# Patient Record
Sex: Male | Born: 1940 | Race: White | Hispanic: No | Marital: Married | Smoking: Never smoker
Health system: Southern US, Community
[De-identification: ages and names within clinical notes are randomized; demographics above are authoritative.]

## PROBLEM LIST (undated history)

## (undated) DIAGNOSIS — I10 Essential (primary) hypertension: Secondary | ICD-10-CM

## (undated) DIAGNOSIS — F32A Depression, unspecified: Secondary | ICD-10-CM

## (undated) DIAGNOSIS — I499 Cardiac arrhythmia, unspecified: Secondary | ICD-10-CM

## (undated) DIAGNOSIS — I739 Peripheral vascular disease, unspecified: Secondary | ICD-10-CM

## (undated) DIAGNOSIS — F329 Major depressive disorder, single episode, unspecified: Secondary | ICD-10-CM

## (undated) DIAGNOSIS — E785 Hyperlipidemia, unspecified: Secondary | ICD-10-CM

## (undated) DIAGNOSIS — J189 Pneumonia, unspecified organism: Secondary | ICD-10-CM

## (undated) DIAGNOSIS — F419 Anxiety disorder, unspecified: Secondary | ICD-10-CM

## (undated) DIAGNOSIS — I7789 Other specified disorders of arteries and arterioles: Secondary | ICD-10-CM

## (undated) DIAGNOSIS — N400 Enlarged prostate without lower urinary tract symptoms: Secondary | ICD-10-CM

## (undated) DIAGNOSIS — G709 Myoneural disorder, unspecified: Secondary | ICD-10-CM

## (undated) DIAGNOSIS — K219 Gastro-esophageal reflux disease without esophagitis: Secondary | ICD-10-CM

## (undated) DIAGNOSIS — M1990 Unspecified osteoarthritis, unspecified site: Secondary | ICD-10-CM

## (undated) HISTORY — PX: EYE SURGERY: SHX253

## (undated) HISTORY — PX: ADENOIDECTOMY: SUR15

## (undated) HISTORY — PX: TONSILLECTOMY: SUR1361

## (undated) HISTORY — PX: BACK SURGERY: SHX140

## (undated) HISTORY — PX: HERNIA REPAIR: SHX51

## (undated) HISTORY — DX: Hyperlipidemia, unspecified: E78.5

---

## 2014-01-24 ENCOUNTER — Other Ambulatory Visit: Payer: Self-pay | Admitting: Neurological Surgery

## 2014-02-02 NOTE — Pre-Procedure Instructions (Signed)
Richard BrawnRobert Williams  02/02/2014   Your procedure is scheduled on:  Thurs, Oct 8 @ 10:00 AM  [facility]  at 8:00 AM.  Call this number if you have problems the morning of surgery: [phone]   Remember:   Do not eat food or drink liquids after midnight.   Take these medicines the morning of surgery with A SIP OF WATER: Wellbutrin(Bupropion),Nasocort(Traimcinolone), and Buspirone(Buspar)               Stop taking your Aspirin. No Goody's,BC's,Aleve,Ibuprofen,Fish Oil,or any Herbal Medications   Do not wear jewelry  Do not wear lotions, powders, or colognes. You may wear deodorant.  Men may shave face and neck.  Do not bring valuables to the hospital.  [facility] is not responsible                  for any belongings or valuables.               Contacts, dentures or bridgework may not be worn into surgery.  Leave suitcase in the car. After surgery it may be brought to your room.  For patients admitted to the hospital, discharge time is determined by your                treatment team.               Patients discharged the day of surgery will not be allowed to drive  home.    Special Instructions:  [facility] - Preparing for Surgery  Before surgery, you can play an important role.  Because skin is not sterile, your skin needs to be as free of germs as possible.  You can reduce the number of germs on you skin by washing with CHG (chlorahexidine gluconate) soap before surgery.  CHG is an antiseptic cleaner which kills germs and bonds with the skin to continue killing germs even after washing.  Please DO NOT use if you have an allergy to CHG or antibacterial soaps.  If your skin becomes reddened/irritated stop using the CHG and inform your nurse when you arrive at Short Stay.  Do not shave (including legs and underarms) for at least 48 hours prior to the first CHG shower.  You may shave your face.  Please follow these instructions carefully:   1.  Shower with CHG Soap  the night before surgery and the                                morning of Surgery.  2.  If you choose to wash your hair, wash your hair first as usual with your       normal shampoo.  3.  After you shampoo, rinse your hair and body thoroughly to remove the                      Shampoo.  4.  Use CHG as you would any other liquid soap.  You can apply chg directly       to the skin and wash gently with scrungie or a clean washcloth.  5.  Apply the CHG Soap to your body ONLY FROM THE NECK DOWN.        Do not use on open wounds or open sores.  Avoid contact with your eyes,       ears, mouth and genitals (private parts).  Wash genitals (  private parts)       with your normal soap.  6.  Wash thoroughly, paying special attention to the area where your surgery        will be performed.  7.  Thoroughly rinse your body with warm water from the neck down.  8.  DO NOT shower/wash with your normal soap after using and rinsing off       the CHG Soap.  9.  Pat yourself dry with a clean towel.            10.  Wear clean pajamas.            11.  Place clean sheets on your bed the night of your first shower and do not        sleep with pets.  Day of Surgery  Do not apply any lotions/deoderants the morning of surgery.  Please wear clean clothes to the hospital/surgery center.     Please read over the following fact sheets that you were given: Pain Booklet, Coughing and Deep Breathing, MRSA Information and Surgical Site Infection Prevention

## 2014-02-03 ENCOUNTER — Encounter (HOSPITAL_COMMUNITY): Payer: Self-pay

## 2014-02-03 ENCOUNTER — Encounter (HOSPITAL_COMMUNITY): Payer: Self-pay | Admitting: Pharmacy Technician

## 2014-02-03 ENCOUNTER — Ambulatory Visit (HOSPITAL_COMMUNITY)
Admission: RE | Admit: 2014-02-03 | Discharge: 2014-02-03 | Disposition: A | Discharge status: Home / Self Care | Payer: Medicare Other | Source: Ambulatory Visit | Attending: Anesthesiology | Admitting: Anesthesiology

## 2014-02-03 ENCOUNTER — Ambulatory Visit (HOSPITAL_COMMUNITY)
Admission: RE | Admit: 2014-02-03 | Discharge: 2014-02-03 | Disposition: A | Discharge status: Home / Self Care | Payer: Medicare Other | Source: Ambulatory Visit | Attending: Neurological Surgery | Admitting: Neurological Surgery

## 2014-02-03 DIAGNOSIS — I10 Essential (primary) hypertension: Secondary | ICD-10-CM | POA: Insufficient documentation

## 2014-02-03 DIAGNOSIS — Z01818 Encounter for other preprocedural examination: Secondary | ICD-10-CM | POA: Diagnosis present

## 2014-02-03 HISTORY — DX: Anxiety disorder, unspecified: F41.9

## 2014-02-03 HISTORY — DX: Major depressive disorder, single episode, unspecified: F32.9

## 2014-02-03 HISTORY — DX: Depression, unspecified: F32.A

## 2014-02-03 HISTORY — DX: Pneumonia, unspecified organism: J18.9

## 2014-02-03 HISTORY — DX: Peripheral vascular disease, unspecified: I73.9

## 2014-02-03 HISTORY — DX: Unspecified osteoarthritis, unspecified site: M19.90

## 2014-02-03 HISTORY — DX: Benign prostatic hyperplasia without lower urinary tract symptoms: N40.0

## 2014-02-03 HISTORY — DX: Gastro-esophageal reflux disease without esophagitis: K21.9

## 2014-02-03 HISTORY — DX: Essential (primary) hypertension: I10

## 2014-02-03 HISTORY — DX: Cardiac arrhythmia, unspecified: I49.9

## 2014-02-03 LAB — CBC
HCT: 40.7 % (ref 39.0–52.0)
Hemoglobin: 14.3 g/dL (ref 13.0–17.0)
MCH: 33.4 pg (ref 26.0–34.0)
MCHC: 35.1 g/dL (ref 30.0–36.0)
MCV: 95.1 fL (ref 78.0–100.0)
PLATELETS: 173 10*3/uL (ref 150–400)
RBC: 4.28 MIL/uL (ref 4.22–5.81)
RDW: 12.1 % (ref 11.5–15.5)
WBC: 6.2 10*3/uL (ref 4.0–10.5)

## 2014-02-03 LAB — BASIC METABOLIC PANEL
Anion gap: 12 (ref 5–15)
BUN: 30 mg/dL — ABNORMAL HIGH (ref 6–23)
CO2: 26 mEq/L (ref 19–32)
Calcium: 9.1 mg/dL (ref 8.4–10.5)
Chloride: 104 mEq/L (ref 96–112)
Creatinine, Ser: 1.19 mg/dL (ref 0.50–1.35)
GFR calc Af Amer: 69 mL/min — ABNORMAL LOW (ref >90)
GFR calc non Af Amer: 60 mL/min — ABNORMAL LOW (ref >90)
Glucose, Bld: 103 mg/dL — ABNORMAL HIGH (ref 70–99)
Potassium: 4.5 mEq/L (ref 3.7–5.3)
Sodium: 142 mEq/L (ref 137–147)

## 2014-02-03 LAB — SURGICAL PCR SCREEN
MRSA, PCR: NEGATIVE
Staphylococcus aureus: NEGATIVE

## 2014-02-03 NOTE — Progress Notes (Signed)
Anesthesia Chart Review:  Pt is 73 year old male scheduled for L1-2, L2-3 lumbar laminectomy for stenosis/epidural mass.   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  BUN 30, Cr normal at 1.19.   Chest x-ray reviewed. Mild bibasilar subsegmental atelectasis. Chest otherwise unremarkable.  EKG: NSR.   If no changes, I anticipate pt can proceed with surgery as scheduled.   Rica Mastngela Marnisha Stampley, FNP-BC [facility]/Anesthesiology Phone: [phone] 02/03/2014 4:40 PM

## 2014-02-09 MED ORDER — CEFAZOLIN SODIUM-DEXTROSE 2-3 GM-% IV SOLR
2.0000 g | INTRAVENOUS | Status: DC
  Filled 2014-02-09: qty 50

## 2014-02-10 ENCOUNTER — Encounter (HOSPITAL_COMMUNITY)
Admission: RE | Disposition: A | Discharge status: Home / Self Care | Payer: Self-pay | Source: Ambulatory Visit | Attending: Neurological Surgery

## 2014-02-10 ENCOUNTER — Inpatient Hospital Stay (HOSPITAL_COMMUNITY): Payer: Medicare Other | Admitting: Critical Care Medicine

## 2014-02-10 ENCOUNTER — Inpatient Hospital Stay (HOSPITAL_COMMUNITY)
Admission: RE | Admit: 2014-02-10 | Discharge: 2014-02-11 | DRG: 027 | Disposition: A | Discharge status: Home / Self Care | Payer: Medicare Other | Source: Ambulatory Visit | Attending: Neurological Surgery | Admitting: Neurological Surgery

## 2014-02-10 ENCOUNTER — Encounter (HOSPITAL_COMMUNITY): Payer: Medicare Other | Admitting: Emergency Medicine

## 2014-02-10 ENCOUNTER — Encounter (HOSPITAL_COMMUNITY): Payer: Self-pay | Admitting: *Deleted

## 2014-02-10 ENCOUNTER — Inpatient Hospital Stay (HOSPITAL_COMMUNITY): Payer: Medicare Other

## 2014-02-10 DIAGNOSIS — M4806 Spinal stenosis, lumbar region: Secondary | ICD-10-CM | POA: Diagnosis present

## 2014-02-10 DIAGNOSIS — I10 Essential (primary) hypertension: Secondary | ICD-10-CM | POA: Diagnosis present

## 2014-02-10 DIAGNOSIS — K219 Gastro-esophageal reflux disease without esophagitis: Secondary | ICD-10-CM | POA: Diagnosis present

## 2014-02-10 DIAGNOSIS — F329 Major depressive disorder, single episode, unspecified: Secondary | ICD-10-CM | POA: Diagnosis present

## 2014-02-10 DIAGNOSIS — I739 Peripheral vascular disease, unspecified: Secondary | ICD-10-CM | POA: Diagnosis present

## 2014-02-10 DIAGNOSIS — F419 Anxiety disorder, unspecified: Secondary | ICD-10-CM | POA: Diagnosis present

## 2014-02-10 DIAGNOSIS — Z7982 Long term (current) use of aspirin: Secondary | ICD-10-CM | POA: Diagnosis not present

## 2014-02-10 DIAGNOSIS — Z9889 Other specified postprocedural states: Secondary | ICD-10-CM

## 2014-02-10 DIAGNOSIS — Z79899 Other long term (current) drug therapy: Secondary | ICD-10-CM | POA: Diagnosis not present

## 2014-02-10 DIAGNOSIS — N400 Enlarged prostate without lower urinary tract symptoms: Secondary | ICD-10-CM | POA: Diagnosis present

## 2014-02-10 DIAGNOSIS — Z885 Allergy status to narcotic agent status: Secondary | ICD-10-CM | POA: Diagnosis not present

## 2014-02-10 DIAGNOSIS — G9619 Other disorders of meninges, not elsewhere classified: Principal | ICD-10-CM | POA: Diagnosis present

## 2014-02-10 DIAGNOSIS — M48061 Spinal stenosis, lumbar region without neurogenic claudication: Secondary | ICD-10-CM

## 2014-02-10 DIAGNOSIS — Z880 Allergy status to penicillin: Secondary | ICD-10-CM

## 2014-02-10 DIAGNOSIS — G9589 Other specified diseases of spinal cord: Secondary | ICD-10-CM | POA: Diagnosis present

## 2014-02-10 HISTORY — DX: Other specified disorders of arteries and arterioles: I77.89

## 2014-02-10 HISTORY — PX: LUMBAR LAMINECTOMY/DECOMPRESSION MICRODISCECTOMY: SHX5026

## 2014-02-10 SURGERY — LUMBAR LAMINECTOMY/DECOMPRESSION MICRODISCECTOMY 2 LEVELS
Anesthesia: General | Site: Back

## 2014-02-10 MED ORDER — FENTANYL CITRATE 0.05 MG/ML IJ SOLN
INTRAMUSCULAR | Status: DC | PRN
  Administered 2014-02-10 (×3): 50 ug via INTRAVENOUS
  Administered 2014-02-10: 100 ug via INTRAVENOUS

## 2014-02-10 MED ORDER — PANTOPRAZOLE SODIUM 40 MG PO TBEC
40.0000 mg | DELAYED_RELEASE_TABLET | Freq: Every day | ORAL | Status: DC
  Administered 2014-02-11: 40 mg via ORAL
  Filled 2014-02-10: qty 1

## 2014-02-10 MED ORDER — LIDOCAINE HCL (CARDIAC) 20 MG/ML IV SOLN
INTRAVENOUS | Status: DC | PRN
  Administered 2014-02-10: 50 mg via INTRAVENOUS

## 2014-02-10 MED ORDER — BUPROPION HCL ER (SR) 100 MG PO TB12
200.0000 mg | ORAL_TABLET | Freq: Two times a day (BID) | ORAL | Status: DC
  Administered 2014-02-10 – 2014-02-11 (×2): 200 mg via ORAL
  Filled 2014-02-10 (×3): qty 2

## 2014-02-10 MED ORDER — METHOCARBAMOL 500 MG PO TABS
500.0000 mg | ORAL_TABLET | Freq: Four times a day (QID) | ORAL | Status: DC | PRN
  Administered 2014-02-10 – 2014-02-11 (×2): 500 mg via ORAL
  Filled 2014-02-10 (×2): qty 1

## 2014-02-10 MED ORDER — SODIUM CHLORIDE 0.9 % IJ SOLN: 3.0000 mL | INTRAMUSCULAR | Status: DC | PRN

## 2014-02-10 MED ORDER — GLYCOPYRROLATE 0.2 MG/ML IJ SOLN
INTRAMUSCULAR | Status: AC
  Filled 2014-02-10: qty 3

## 2014-02-10 MED ORDER — PROPOFOL 10 MG/ML IV BOLUS
INTRAVENOUS | Status: DC | PRN
  Administered 2014-02-10: 170 mg via INTRAVENOUS

## 2014-02-10 MED ORDER — ONDANSETRON HCL 4 MG/2ML IJ SOLN: 4.0000 mg | Freq: Once | INTRAMUSCULAR | Status: DC | PRN

## 2014-02-10 MED ORDER — VANCOMYCIN HCL IN DEXTROSE 1-5 GM/200ML-% IV SOLN
INTRAVENOUS | Status: AC
  Filled 2014-02-10: qty 200

## 2014-02-10 MED ORDER — PHENOL 1.4 % MT LIQD: 1.0000 | OROMUCOSAL | Status: DC | PRN

## 2014-02-10 MED ORDER — MENTHOL 3 MG MT LOZG
1.0000 | LOZENGE | OROMUCOSAL | Status: DC | PRN
Start: 2014-02-10 — End: 2014-02-11

## 2014-02-10 MED ORDER — DEXAMETHASONE SODIUM PHOSPHATE 4 MG/ML IJ SOLN
4.0000 mg | Freq: Four times a day (QID) | INTRAMUSCULAR | Status: DC
  Filled 2014-02-10 (×4): qty 1

## 2014-02-10 MED ORDER — THROMBIN 5000 UNITS EX SOLR
OROMUCOSAL | Status: DC | PRN
  Administered 2014-02-10: 14:00:00 via TOPICAL

## 2014-02-10 MED ORDER — ONDANSETRON HCL 4 MG/2ML IJ SOLN
INTRAMUSCULAR | Status: DC | PRN
  Administered 2014-02-10: 4 mg via INTRAVENOUS

## 2014-02-10 MED ORDER — ACETAMINOPHEN 325 MG PO TABS: 650.0000 mg | ORAL_TABLET | ORAL | Status: DC | PRN

## 2014-02-10 MED ORDER — ROCURONIUM BROMIDE 100 MG/10ML IV SOLN
INTRAVENOUS | Status: DC | PRN
  Administered 2014-02-10: 50 mg via INTRAVENOUS

## 2014-02-10 MED ORDER — GLYCOPYRROLATE 0.2 MG/ML IJ SOLN
INTRAMUSCULAR | Status: DC | PRN
  Administered 2014-02-10: 0.6 mg via INTRAVENOUS

## 2014-02-10 MED ORDER — DEXAMETHASONE 4 MG PO TABS
4.0000 mg | ORAL_TABLET | Freq: Four times a day (QID) | ORAL | Status: DC
  Administered 2014-02-10 – 2014-02-11 (×4): 4 mg via ORAL
  Filled 2014-02-10 (×6): qty 1

## 2014-02-10 MED ORDER — OXYCODONE-ACETAMINOPHEN 5-325 MG PO TABS
1.0000 | ORAL_TABLET | ORAL | Status: DC | PRN
  Administered 2014-02-10 – 2014-02-11 (×3): 2 via ORAL
  Filled 2014-02-10 (×3): qty 2

## 2014-02-10 MED ORDER — FENTANYL CITRATE 0.05 MG/ML IJ SOLN: 25.0000 ug | INTRAMUSCULAR | Status: DC | PRN

## 2014-02-10 MED ORDER — ASPIRIN EC 81 MG PO TBEC
81.0000 mg | DELAYED_RELEASE_TABLET | Freq: Every day | ORAL | Status: DC
  Administered 2014-02-11: 81 mg via ORAL
  Filled 2014-02-10: qty 1

## 2014-02-10 MED ORDER — MIDAZOLAM HCL 2 MG/2ML IJ SOLN
INTRAMUSCULAR | Status: AC
  Filled 2014-02-10: qty 2

## 2014-02-10 MED ORDER — MIDAZOLAM HCL 5 MG/5ML IJ SOLN
INTRAMUSCULAR | Status: DC | PRN
  Administered 2014-02-10: 2 mg via INTRAVENOUS

## 2014-02-10 MED ORDER — HYDROCHLOROTHIAZIDE 25 MG PO TABS
25.0000 mg | ORAL_TABLET | ORAL | Status: DC
  Administered 2014-02-11: 25 mg via ORAL
  Filled 2014-02-10: qty 1

## 2014-02-10 MED ORDER — POTASSIUM CHLORIDE IN NACL 20-0.9 MEQ/L-% IV SOLN
INTRAVENOUS | Status: DC
  Filled 2014-02-10 (×4): qty 1000

## 2014-02-10 MED ORDER — LISINOPRIL 20 MG PO TABS
20.0000 mg | ORAL_TABLET | Freq: Every day | ORAL | Status: DC
  Administered 2014-02-10 – 2014-02-11 (×2): 20 mg via ORAL
  Filled 2014-02-10 (×3): qty 1

## 2014-02-10 MED ORDER — CELECOXIB 200 MG PO CAPS
200.0000 mg | ORAL_CAPSULE | Freq: Every day | ORAL | Status: DC
  Administered 2014-02-11: 200 mg via ORAL
  Filled 2014-02-10: qty 1

## 2014-02-10 MED ORDER — NEOSTIGMINE METHYLSULFATE 10 MG/10ML IV SOLN
INTRAVENOUS | Status: DC | PRN
  Administered 2014-02-10: 4 mg via INTRAVENOUS

## 2014-02-10 MED ORDER — CEFAZOLIN SODIUM 1-5 GM-% IV SOLN
1.0000 g | Freq: Three times a day (TID) | INTRAVENOUS | Status: AC
  Administered 2014-02-10 – 2014-02-11 (×2): 1 g via INTRAVENOUS
  Filled 2014-02-10 (×2): qty 50

## 2014-02-10 MED ORDER — BUSPIRONE HCL 10 MG PO TABS
10.0000 mg | ORAL_TABLET | Freq: Two times a day (BID) | ORAL | Status: DC
  Administered 2014-02-10 – 2014-02-11 (×2): 10 mg via ORAL
  Filled 2014-02-10 (×3): qty 1

## 2014-02-10 MED ORDER — DEXAMETHASONE SODIUM PHOSPHATE 4 MG/ML IJ SOLN
INTRAMUSCULAR | Status: DC | PRN
  Administered 2014-02-10: 4 mg via INTRAVENOUS

## 2014-02-10 MED ORDER — NEOSTIGMINE METHYLSULFATE 10 MG/10ML IV SOLN
INTRAVENOUS | Status: AC
  Filled 2014-02-10: qty 1

## 2014-02-10 MED ORDER — HEMOSTATIC AGENTS (NO CHARGE) OPTIME
TOPICAL | Status: DC | PRN
  Administered 2014-02-10: 1 via TOPICAL

## 2014-02-10 MED ORDER — SODIUM CHLORIDE 0.9 % IJ SOLN
3.0000 mL | Freq: Two times a day (BID) | INTRAMUSCULAR | Status: DC
  Administered 2014-02-10: 3 mL via INTRAVENOUS

## 2014-02-10 MED ORDER — SODIUM CHLORIDE 0.9 % IR SOLN
Status: DC | PRN
  Administered 2014-02-10: 14:00:00

## 2014-02-10 MED ORDER — FENTANYL CITRATE 0.05 MG/ML IJ SOLN
INTRAMUSCULAR | Status: AC
  Filled 2014-02-10: qty 5

## 2014-02-10 MED ORDER — ONDANSETRON HCL 4 MG/2ML IJ SOLN: 4.0000 mg | INTRAMUSCULAR | Status: DC | PRN

## 2014-02-10 MED ORDER — LACTATED RINGERS IV SOLN
INTRAVENOUS | Status: DC
  Administered 2014-02-10 (×2): via INTRAVENOUS

## 2014-02-10 MED ORDER — MORPHINE SULFATE 2 MG/ML IJ SOLN
1.0000 mg | INTRAMUSCULAR | Status: DC | PRN
Start: 2014-02-10 — End: 2014-02-11

## 2014-02-10 MED ORDER — 0.9 % SODIUM CHLORIDE (POUR BTL) OPTIME
TOPICAL | Status: DC | PRN
  Administered 2014-02-10: 1000 mL

## 2014-02-10 MED ORDER — METHOCARBAMOL 1000 MG/10ML IJ SOLN
500.0000 mg | Freq: Four times a day (QID) | INTRAVENOUS | Status: DC | PRN
  Filled 2014-02-10: qty 5

## 2014-02-10 MED ORDER — VANCOMYCIN HCL IN DEXTROSE 1-5 GM/200ML-% IV SOLN
1000.0000 mg | Freq: Once | INTRAVENOUS | Status: AC
  Administered 2014-02-10: 1000 mg via INTRAVENOUS

## 2014-02-10 MED ORDER — BUPIVACAINE HCL (PF) 0.25 % IJ SOLN
INTRAMUSCULAR | Status: DC | PRN
  Administered 2014-02-10: 3 mL

## 2014-02-10 MED ORDER — TERAZOSIN HCL 2 MG PO CAPS
4.0000 mg | ORAL_CAPSULE | Freq: Every day | ORAL | Status: DC
  Administered 2014-02-10: 4 mg via ORAL
  Filled 2014-02-10 (×3): qty 2

## 2014-02-10 MED ORDER — THROMBIN 5000 UNITS EX SOLR
CUTANEOUS | Status: DC | PRN
  Administered 2014-02-10 (×2): 5000 [IU] via TOPICAL

## 2014-02-10 MED ORDER — DEXAMETHASONE SODIUM PHOSPHATE 4 MG/ML IJ SOLN
INTRAMUSCULAR | Status: AC
  Filled 2014-02-10: qty 1

## 2014-02-10 MED ORDER — ACETAMINOPHEN 650 MG RE SUPP: 650.0000 mg | RECTAL | Status: DC | PRN

## 2014-02-10 SURGICAL SUPPLY — 47 items
BAG DECANTER FOR FLEXI CONT (MISCELLANEOUS) ×3 IMPLANT
BENZOIN TINCTURE PRP APPL 2/3 (GAUZE/BANDAGES/DRESSINGS) ×3 IMPLANT
BUR MATCHSTICK NEURO 3.0 LAGG (BURR) ×3 IMPLANT
CANISTER SUCT 3000ML (MISCELLANEOUS) ×3 IMPLANT
CLOSURE WOUND 1/2 X4 (GAUZE/BANDAGES/DRESSINGS) ×1
CONT SPEC 4OZ CLIKSEAL STRL BL (MISCELLANEOUS) ×3 IMPLANT
DRAPE LAPAROTOMY 100X72X124 (DRAPES) ×3 IMPLANT
DRAPE MICROSCOPE LEICA (MISCELLANEOUS) ×3 IMPLANT
DRAPE POUCH INSTRU U-SHP 10X18 (DRAPES) ×3 IMPLANT
DRAPE SURG 17X23 STRL (DRAPES) ×3 IMPLANT
DRSG OPSITE 4X5.5 SM (GAUZE/BANDAGES/DRESSINGS) ×3 IMPLANT
DRSG OPSITE POSTOP 4X6 (GAUZE/BANDAGES/DRESSINGS) ×3 IMPLANT
DRSG TELFA 3X8 NADH (GAUZE/BANDAGES/DRESSINGS) ×3 IMPLANT
DURAPREP 26ML APPLICATOR (WOUND CARE) ×3 IMPLANT
ELECT REM PT RETURN 9FT ADLT (ELECTROSURGICAL) ×3
ELECTRODE REM PT RTRN 9FT ADLT (ELECTROSURGICAL) ×1 IMPLANT
GAUZE SPONGE 4X4 16PLY XRAY LF (GAUZE/BANDAGES/DRESSINGS) IMPLANT
GLOVE BIO SURGEON STRL SZ8 (GLOVE) ×3 IMPLANT
GLOVE BIOGEL PI IND STRL 8.5 (GLOVE) ×2 IMPLANT
GLOVE BIOGEL PI INDICATOR 8.5 (GLOVE) ×4
GLOVE ECLIPSE 7.5 STRL STRAW (GLOVE) ×6 IMPLANT
GLOVE ECLIPSE 8.0 STRL XLNG CF (GLOVE) ×3 IMPLANT
GLOVE INDICATOR 7.5 STRL GRN (GLOVE) ×3 IMPLANT
GOWN STRL REUS W/ TWL LRG LVL3 (GOWN DISPOSABLE) IMPLANT
GOWN STRL REUS W/ TWL XL LVL3 (GOWN DISPOSABLE) ×3 IMPLANT
GOWN STRL REUS W/TWL 2XL LVL3 (GOWN DISPOSABLE) ×3 IMPLANT
GOWN STRL REUS W/TWL LRG LVL3 (GOWN DISPOSABLE)
GOWN STRL REUS W/TWL XL LVL3 (GOWN DISPOSABLE) ×6
HEMOSTAT POWDER KIT SURGIFOAM (HEMOSTASIS) IMPLANT
KIT BASIN OR (CUSTOM PROCEDURE TRAY) ×3 IMPLANT
KIT ROOM TURNOVER OR (KITS) ×3 IMPLANT
NEEDLE HYPO 25X1 1.5 SAFETY (NEEDLE) ×3 IMPLANT
NEEDLE SPNL 20GX3.5 QUINCKE YW (NEEDLE) IMPLANT
NS IRRIG 1000ML POUR BTL (IV SOLUTION) ×3 IMPLANT
PACK LAMINECTOMY NEURO (CUSTOM PROCEDURE TRAY) ×3 IMPLANT
PAD ARMBOARD 7.5X6 YLW CONV (MISCELLANEOUS) ×9 IMPLANT
RUBBERBAND STERILE (MISCELLANEOUS) ×6 IMPLANT
SPONGE SURGIFOAM ABS GEL SZ50 (HEMOSTASIS) ×3 IMPLANT
STRIP CLOSURE SKIN 1/2X4 (GAUZE/BANDAGES/DRESSINGS) ×2 IMPLANT
SUT VIC AB 0 CT1 18XCR BRD8 (SUTURE) ×1 IMPLANT
SUT VIC AB 0 CT1 8-18 (SUTURE) ×2
SUT VIC AB 2-0 CP2 18 (SUTURE) ×3 IMPLANT
SUT VIC AB 3-0 SH 8-18 (SUTURE) ×3 IMPLANT
SYR 20ML ECCENTRIC (SYRINGE) ×3 IMPLANT
TOWEL OR 17X24 6PK STRL BLUE (TOWEL DISPOSABLE) ×3 IMPLANT
TOWEL OR 17X26 10 PK STRL BLUE (TOWEL DISPOSABLE) ×3 IMPLANT
WATER STERILE IRR 1000ML POUR (IV SOLUTION) ×3 IMPLANT

## 2014-02-10 NOTE — Op Note (Signed)
02/10/2014  3:03 PM  PATIENT:  Richard Williams  73 y.o. male  PRE-OPERATIVE DIAGNOSIS:  1. 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 Decompressive lumbar laminectomy with resection of dorsal cystic epidural mass L1-2, 2. Decompressive lumbar hemilaminectomy, medial facetectomy and foraminotomies bilaterally for decompression of spinal stenosis at L2-3  SURGEON:  Marikay Alaravid Josey Dettmann, MD  ASSISTANTS: Dr. Danielle DessElsner  ANESTHESIA:   General  EBL: 150 ml  Total I/O In: 1300 [I.V.:1300] Out: 150 [Blood:150]  BLOOD ADMINISTERED:none  DRAINS: None   SPECIMEN:  Excision  INDICATION FOR PROCEDURE: This patient presented with episodic numbness and weakness in his legs. He had some back pain and some aching in his legs. MRI showed spinal stenosis at L2-3 with a large dorsal cystic epidural mass at L1 to. I recommended decompressive laminectomy with resection of mass and decompression at L2-3. Patient understood the risks, benefits, and alternatives and potential outcomes and wished to proceed.  PROCEDURE DETAILS: The patient was taken to the operating room and after induction of adequate generalized endotracheal anesthesia, the patient was rolled into the prone position on the Wilson frame and all pressure points were padded. The lumbar region was cleaned and then prepped with DuraPrep and draped in the usual sterile fashion. 5 cc of local anesthesia was injected and then a dorsal midline incision was made and carried down to the lumbo sacral fascia. The fascia was opened and the paraspinous musculature was taken down in a subperiosteal fashion to expose L1-2 and L2-3. Intraoperative x-ray confirmed my level, and then I used a combination of the high-speed drill and the Kerrison punches to perform a hemilaminectomy, medial facetectomy, and foraminotomy at bilaterally at L2-3 and L1-2. The underlying yellow ligament was  opened and removed in a piecemeal fashion to expose the underlying dura and exiting nerve roots bilaterally. I undercut the lateral recess and dissected down until I was medial to and distal to the pedicle of L2 and L3. The nerve root was well decompressed bilaterally on each side. There was a large dorsal cystic epidural mass with some evidence of intracystic hemorrhage at L1 to. This was removed with nice relaxation of the dura. It appeared to emanate from the last L1 to set. 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 I then palpated with a coronary dilator along the nerve root and into the foramen to assure adequate decompression at L1-2 and L2-3 bilateral. I felt no more compression of the nerve roots. I irrigated with saline solution containing bacitracin. Achieved hemostasis with bipolar cautery, lined the dura with Gelfoam, and then closed the fascia with 0 Vicryl. I closed the subcutaneous tissues with 2-0 Vicryl and the subcuticular tissues with 3-0 Vicryl. The skin was then closed with benzoin and Steri-Strips. The drapes were removed, a sterile dressing was applied. The patient was awakened from general anesthesia and transferred to the recovery room in stable condition. At the end of the procedure all sponge, needle and instrument counts were correct.   PLAN OF CARE: Admit to inpatient   PATIENT DISPOSITION:  PACU - hemodynamically stable.   Delay start of Pharmacological VTE agent (>24hrs) due to surgical blood loss or risk of bleeding:  yes

## 2014-02-10 NOTE — Anesthesia Postprocedure Evaluation (Signed)
  Anesthesia Post-op Note  Patient: Richard BrawnRobert Williams  Procedure(s) Performed: Procedure(s) with comments: Lumbar One to Two, Lumbar Two to Three Lumbar laminectomy for stenosis/epidural mass (N/A) - L1-2 L2-3 Lumbar laminectomy for stenosis/epidural mass  Patient Location: PACU  Anesthesia Type:General  Level of Consciousness: awake, alert  and oriented  Airway and Oxygen Therapy: Patient Spontanous Breathing and Patient connected to nasal cannula oxygen  Post-op Pain: mild  Post-op Assessment: Post-op Vital signs reviewed, Patient's Cardiovascular Status Stable, Respiratory Function Stable, Patent Airway, No signs of Nausea or vomiting and Pain level controlled  Post-op Vital Signs: stable  Last Vitals:  Filed Vitals:   02/10/14 1600  BP:   Pulse:   Temp: 36.7 C  Resp:     Complications: No apparent anesthesia complications

## 2014-02-10 NOTE — H&P (Signed)
Subjective: Patient is a 73 y.o. male admitted for DLL for cyst/ stenosis. Onset of symptoms was several months ago, gradually worsening since that time.  The pain is rated moderate, and is located at the across the lower back and radiates to legs. He has episodic weakness in legs.The pain is described as aching and occurs intermittently. The symptoms have been progressive. Symptoms are exacerbated by nothing in particular. MRI or CT showed stenosis with large cyst L1-2, L2-3.   Past Medical History  Diagnosis Date  . Hypertension   . 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 Peripheral vascular disease     surface blood clot x1 top of left foot  . Pneumonia     years ago 5,  . Depression   . Anxiety   . GERD (gastroesophageal reflux disease)     on meds  . Arthritis   . BPH (benign prostatic hypertrophy)     on meds  . Enlarged aorta     Past Surgical History  Procedure Laterality Date  . Eye surgery Bilateral     cataracts    Prior to Admission medications   Medication Sig Start Date End Date Taking? Authorizing Provider  aspirin 81 MG tablet Take 81 mg by mouth daily.   Yes Historical Provider, MD  buPROPion (WELLBUTRIN SR) 200 MG 12 hr tablet Take 200 mg by mouth 2 (two) times daily.   Yes Historical Provider, MD  busPIRone (BUSPAR) 10 MG tablet Take 10 mg by mouth 2 (two) times daily.   Yes Historical Provider, MD  celecoxib (CELEBREX) 200 MG capsule Take 200 mg by mouth daily.   Yes Historical Provider, MD  esomeprazole (NEXIUM) 20 MG capsule Take 20 mg by mouth daily at 12 noon.   Yes Historical Provider, MD  hydrochlorothiazide (HYDRODIURIL) 25 MG tablet Take 25 mg by mouth every other day.   Yes Historical Provider, MD  lisinopril (PRINIVIL,ZESTRIL) 20 MG tablet Take 20 mg by mouth daily.   Yes Historical Provider, MD  Potassium Gluconate 550 MG TABS Take 550 mg by mouth daily.   Yes Historical Provider, MD  terazosin (HYTRIN) 2 MG capsule Take 4 mg by  mouth daily.   Yes Historical Provider, MD  triamcinolone (NASACORT) 55 MCG/ACT AERO nasal inhaler Place 2 sprays into the nose daily as needed.   Yes Historical Provider, MD   Allergies  Allergen Reactions  . Amoxicillin     Cold sores   . Codeine Hives    History  Substance Use Topics  . Smoking status: Never Smoker   . Smokeless tobacco: Never Used  . Alcohol Use: Yes     Comment: social    History reviewed. No pertinent family history.   Review of Systems  Positive ROS: neg  All other systems have been reviewed and were otherwise negative with the exception of those mentioned in the HPI and as above.  Objective: Vital signs in last 24 hours: Temp:  [97.6 F (36.4 C)] 97.6 F (36.4 C) (10/08 0746) Pulse Rate:  [94] 94 (10/08 0746) Resp:  [20] 20 (10/08 0746) BP: (150)/(87) 150/87 mmHg (10/08 0746) SpO2:  [99 %] 99 % (10/08 0746) Weight:  [92.08 kg (203 lb)] 92.08 kg (203 lb) (10/08 0746)  General Appearance: Alert, cooperative, no distress, appears stated age Head: Normocephalic, without obvious abnormality, atraumatic Eyes: PERRL, conjunctiva/corneas clear, EOM's intact    Neck: Supple, symmetrical, trachea midline Back: Symmetric, no curvature, ROM normal,  no CVA tenderness Lungs:  respirations unlabored Heart: Regular rate and rhythm Abdomen: Soft, non-tender Extremities: Extremities normal, atraumatic, no cyanosis or edema Pulses: 2+ and symmetric all extremities Skin: Skin color, texture, turgor normal, no rashes or lesions  NEUROLOGIC:   Mental status: Alert and oriented x4,  no aphasia, good attention span, fund of knowledge, and memory Motor Exam - grossly normal Sensory Exam - grossly normal Reflexes: 1+ Coordination - grossly normal Gait - grossly normal Balance - grossly normal Cranial Nerves: I: smell Not tested  II: visual acuity  OS: nl    OD: nl  II: visual fields Full to confrontation  II: pupils Equal, round, reactive to light  III,VII:  ptosis None  III,IV,VI: extraocular muscles  Full ROM  V: mastication Normal  V: facial light touch sensation  Normal  V,VII: corneal reflex  Present  VII: facial muscle function - upper  Normal  VII: facial muscle function - lower Normal  VIII: hearing Not tested  IX: soft palate elevation  Normal  IX,X: gag reflex Present  XI: trapezius strength  5/5  XI: sternocleidomastoid strength 5/5  XI: neck flexion strength  5/5  XII: tongue strength  Normal    Data Review Lab Results  Component Value Date   WBC 6.2 02/03/2014   HGB 14.3 02/03/2014   HCT 40.7 02/03/2014   MCV 95.1 02/03/2014   PLT 173 02/03/2014   Lab Results  Component Value Date   NA 142 02/03/2014   K 4.5 02/03/2014   CL 104 02/03/2014   CO2 26 02/03/2014   BUN 30* 02/03/2014   CREATININE 1.19 02/03/2014   GLUCOSE 103* 02/03/2014   No results found for this basename: INR, PROTIME    Assessment/Plan: Patient admitted for [facility] for stenosis. Patient has failed a reasonable attempt at conservative therapy.  I explained the condition and procedure to the patient and answered any questions.  Patient wishes to proceed with procedure as planned. Understands risks/ benefits and typical outcomes of procedure.   Jaelani Posa S 02/10/2014 1:02 PM

## 2014-02-10 NOTE — Progress Notes (Signed)
Requested [facility] from [facility] Haywood [facility] [place] (last office notes/ekg/stress test/echo)  Left message at Dr. Yetta BarreJones' office concerning pt's allergy to amoxicillin and ancef orderd for pt.

## 2014-02-10 NOTE — Anesthesia Preprocedure Evaluation (Addendum)
Anesthesia Evaluation  Patient identified by MRN, date of birth, ID band Patient awake    Reviewed: Allergy & Precautions, H&P , Patient's Chart, lab work & pertinent test results  Airway Mallampati: II TM Distance: >3 FB Neck ROM: Full    Dental  (+) Dental Advisory Given, Teeth Intact   Pulmonary  breath sounds clear to auscultation        Cardiovascular hypertension, Pt. on medications + Peripheral Vascular Disease Rhythm:Regular Rate:Normal     Neuro/Psych PSYCHIATRIC DISORDERS Anxiety Depression    GI/Hepatic GERD-  Medicated,  Endo/Other    Renal/GU      Musculoskeletal  (+) Arthritis -,   Abdominal   Peds  Hematology   Anesthesia Other Findings   Reproductive/Obstetrics                          Anesthesia Physical Anesthesia Plan  ASA: II  Anesthesia Plan: General   Post-op Pain Management:    Induction: Intravenous  Airway Management Planned: Oral ETT  Additional Equipment:   Intra-op Plan:   Post-operative Plan: Extubation in OR  Informed Consent: I have reviewed the patients History and Physical, chart, labs and discussed the procedure including the risks, benefits and alternatives for the proposed anesthesia with the patient or authorized representative who has indicated his/her understanding and acceptance.   Dental advisory given  Plan Discussed with: Anesthesiologist and CRNA  Anesthesia Plan Comments: (Lumbar spinal stenosis Hypertension   Plan GA with oral ETT  Kipp Broodavid Lilias Lorensen, MD)        Anesthesia Quick Evaluation

## 2014-02-10 NOTE — Plan of Care (Signed)
Problem: Consults Goal: Diagnosis - Spinal Surgery Outcome: Completed/Met Date Met:  02/10/14 2 LEVEL LUMBAR LAMINECTOMY FOR STENOSIS AND EPIDURAL MASS     

## 2014-02-10 NOTE — Anesthesia Procedure Notes (Signed)
Procedure Name: Intubation Date/Time: 02/10/2014 1:07 PM Performed by: Elon AlasLEE, Clemencia Helzer BROWN Pre-anesthesia Checklist: Patient identified, Timeout performed, Emergency Drugs available, Suction available and Patient being monitored Patient Re-evaluated:Patient Re-evaluated prior to inductionOxygen Delivery Method: Circle system utilized Preoxygenation: Pre-oxygenation with 100% oxygen Intubation Type: IV induction Ventilation: Mask ventilation without difficulty Laryngoscope Size: Mac and 4 Grade View: Grade II Tube type: Oral Tube size: 7.5 mm Number of attempts: 1 Airway Equipment and Method: Stylet Placement Confirmation: CO2 detector,  positive ETCO2,  ETT inserted through vocal cords under direct vision and breath sounds checked- equal and bilateral Secured at: 22 cm Tube secured with: Tape Dental Injury: Teeth and Oropharynx as per pre-operative assessment

## 2014-02-10 NOTE — Transfer of Care (Signed)
Immediate Anesthesia Transfer of Care Note  Patient: Richard BrawnRobert Williams  Procedure(s) Performed: Procedure(s) with comments: Lumbar One to Two, Lumbar Two to Three Lumbar laminectomy for stenosis/epidural mass (N/A) - L1-2 L2-3 Lumbar laminectomy for stenosis/epidural mass  Patient Location: PACU  Anesthesia Type:General  Level of Consciousness: awake  Airway & Oxygen Therapy: Patient Spontanous Breathing and Patient connected to nasal cannula oxygen  Post-op Assessment: Report given to PACU RN, Post -op Vital signs reviewed and stable and Patient moving all extremities X 4  Post vital signs: Reviewed and stable  Complications: No apparent anesthesia complications

## 2014-02-10 NOTE — Progress Notes (Signed)
Utilization review completed.  

## 2014-02-10 NOTE — Progress Notes (Signed)
Neuro pacu full. Waiting on transportation help to take pt to room

## 2014-02-11 ENCOUNTER — Encounter (HOSPITAL_COMMUNITY): Payer: Self-pay | Admitting: Neurological Surgery

## 2014-02-11 MED ORDER — METHOCARBAMOL 500 MG PO TABS: 500.0000 mg | ORAL_TABLET | Freq: Four times a day (QID) | ORAL | Status: DC | PRN

## 2014-02-11 MED ORDER — OXYCODONE-ACETAMINOPHEN 5-325 MG PO TABS: 1.0000 | ORAL_TABLET | ORAL | Status: DC | PRN

## 2014-02-11 NOTE — Discharge Instructions (Signed)
Wound Care  Keep the incision clean and dry remove the outer dressing in 3 days, leave the Steri-Strips intact.  Do not put any creams, lotions, or ointments on incision. Leave steri-strips on back.  They will fall off by themselves.  Activity Walk each and every day, increasing distance each day. No lifting greater than 5 lbs.  No lifting no bending no twisting no driving or riding a car unless coming back and forth to see  Dr. Yetta BarreJones. If provided with back brace, wear when out of bed.  It is not necessary to wear brace in bed. Diet Resume your normal diet.   Return to Work Will be discussed at you follow up appointment.  Call Your Doctor If Any of These Occur Redness, drainage, or swelling at the wound.  Temperature greater than 101 degrees. Severe pain not relieved by pain medication. Incision starts to come apart. Follow Up Appt Call today for appointment in 1-2 weeks [phone] or for problems.  If you have any hardware placed in your spine, you will need an x-ray before your appointment.

## 2014-02-11 NOTE — Discharge Summary (Signed)
  Physician Discharge Summary  Patient ID: Richard BrawnRobert Williams MRN: [id number] DOB/AGE: Mar 18, 1941 73 y.o.  Admit date: 02/10/2014 Discharge date: 02/11/2014  Admission Diagnoses: Cystic mass L1-L2 3  Discharge Diagnoses: Same Active Problems:   S/P lumbar laminectomy   Discharged Condition: good  Hospital Course: Patient to the hospital underwent a decompressive laminectomies at L1-L2. Postoperatively are well recovered in the floor on the floor angling and voiding spontaneously tolerating regular diet and was stable for discharge patient will be discharged her scheduled followup with a Jones 1-2 weeks.  Consults: Significant Diagnostic Studies: Treatments: Decompressive laminectomy with L1-L2 3 Discharge Exam: Blood pressure 107/76, pulse 110, temperature 98.3 F (36.8 C), temperature source Oral, resp. rate 20, weight 92.08 kg (203 lb), SpO2 97.00%. Strength out of 5 wound clean dry and intact  Disposition: Home     Medication List         aspirin 81 MG tablet  Take 81 mg by mouth daily.     buPROPion 200 MG 12 hr tablet  Commonly known as:  WELLBUTRIN SR  Take 200 mg by mouth 2 (two) times daily.     busPIRone 10 MG tablet  Commonly known as:  BUSPAR  Take 10 mg by mouth 2 (two) times daily.     celecoxib 200 MG capsule  Commonly known as:  CELEBREX  Take 200 mg by mouth daily.     esomeprazole 20 MG capsule  Commonly known as:  NEXIUM  Take 20 mg by mouth daily at 12 noon.     hydrochlorothiazide 25 MG tablet  Commonly known as:  HYDRODIURIL  Take 25 mg by mouth every other day.     lisinopril 20 MG tablet  Commonly known as:  PRINIVIL,ZESTRIL  Take 20 mg by mouth daily.     methocarbamol 500 MG tablet  Commonly known as:  ROBAXIN  Take 1 tablet (500 mg total) by mouth every 6 (six) hours as needed for muscle spasms.     oxyCODONE-acetaminophen 5-325 MG per tablet  Commonly known as:  PERCOCET/ROXICET  Take 1-2 tablets by mouth every 4 (four) hours as  needed for moderate pain.     Potassium Gluconate 550 MG Tabs  Take 550 mg by mouth daily.     terazosin 2 MG capsule  Commonly known as:  HYTRIN  Take 4 mg by mouth daily.     triamcinolone 55 MCG/ACT Aero nasal inhaler  Commonly known as:  NASACORT  Place 2 sprays into the nose daily as needed.           Follow-up Information   Follow up with Tia AlertJONES,DAVID S, MD.   Specialty:  Neurosurgery   Contact information:   [address] [place] [address] [phone]       Signed: Mariam DollarCRAM,Aine Strycharz P 02/11/2014, 11:36 AM

## 2014-02-11 NOTE — Progress Notes (Signed)
Patient ID: Richard BrawnRobert Ogborn, male   DOB: 10/24/1940, 73 y.o.   MRN: [id number] Doing well discharged home

## 2014-02-11 NOTE — Progress Notes (Signed)
Patient alert and oriented, mae's well, voiding adequate amount of urine, swallowing without difficulty, no c/o pain or nausea. Patient discharged home with spouse. Script and discharged instructions given to patient. Patient and family stated understanding of d/c instructions given and has an appointment with MD. Marin RobertsAisha Coretta Leisey RN.

## 2014-02-25 ENCOUNTER — Other Ambulatory Visit: Payer: Self-pay | Admitting: Internal Medicine

## 2014-02-25 DIAGNOSIS — I714 Abdominal aortic aneurysm, without rupture, unspecified: Secondary | ICD-10-CM

## 2014-03-03 ENCOUNTER — Ambulatory Visit
Admission: RE | Admit: 2014-03-03 | Discharge: 2014-03-03 | Disposition: A | Discharge status: Home / Self Care | Payer: Medicare Other | Source: Ambulatory Visit | Attending: Internal Medicine | Admitting: Internal Medicine

## 2014-03-03 DIAGNOSIS — I714 Abdominal aortic aneurysm, without rupture, unspecified: Secondary | ICD-10-CM

## 2014-10-12 ENCOUNTER — Ambulatory Visit
Admission: RE | Admit: 2014-10-12 | Discharge: 2014-10-12 | Disposition: A | Discharge status: Home / Self Care | Payer: Medicare Other | Source: Ambulatory Visit | Attending: Internal Medicine | Admitting: Internal Medicine

## 2014-10-12 ENCOUNTER — Other Ambulatory Visit: Payer: Self-pay | Admitting: Internal Medicine

## 2014-10-12 DIAGNOSIS — R0602 Shortness of breath: Secondary | ICD-10-CM

## 2015-09-26 ENCOUNTER — Other Ambulatory Visit: Payer: Self-pay | Admitting: Neurological Surgery

## 2015-09-26 DIAGNOSIS — R269 Unspecified abnormalities of gait and mobility: Secondary | ICD-10-CM

## 2015-10-07 ENCOUNTER — Ambulatory Visit
Admission: RE | Admit: 2015-10-07 | Discharge: 2015-10-07 | Disposition: A | Discharge status: Home / Self Care | Payer: Medicare Other | Source: Ambulatory Visit | Attending: Neurological Surgery | Admitting: Neurological Surgery

## 2015-10-07 DIAGNOSIS — R269 Unspecified abnormalities of gait and mobility: Secondary | ICD-10-CM

## 2015-10-07 MED ORDER — GADOBENATE DIMEGLUMINE 529 MG/ML IV SOLN
9.0000 mL | Freq: Once | INTRAVENOUS | Status: AC | PRN
  Administered 2015-10-07: 9 mL via INTRAVENOUS

## 2015-10-25 ENCOUNTER — Other Ambulatory Visit: Payer: Self-pay | Admitting: General Surgery

## 2015-10-31 ENCOUNTER — Encounter (HOSPITAL_COMMUNITY): Payer: Self-pay

## 2015-10-31 ENCOUNTER — Other Ambulatory Visit (HOSPITAL_COMMUNITY): Payer: Self-pay | Admitting: *Deleted

## 2015-10-31 ENCOUNTER — Encounter (HOSPITAL_COMMUNITY)
Admission: RE | Admit: 2015-10-31 | Discharge: 2015-10-31 | Disposition: A | Discharge status: Home / Self Care | Payer: Medicare Other | Source: Ambulatory Visit | Attending: General Surgery | Admitting: General Surgery

## 2015-10-31 DIAGNOSIS — R9431 Abnormal electrocardiogram [ECG] [EKG]: Secondary | ICD-10-CM | POA: Insufficient documentation

## 2015-10-31 DIAGNOSIS — I10 Essential (primary) hypertension: Secondary | ICD-10-CM | POA: Diagnosis not present

## 2015-10-31 DIAGNOSIS — Z01812 Encounter for preprocedural laboratory examination: Secondary | ICD-10-CM | POA: Diagnosis not present

## 2015-10-31 DIAGNOSIS — Z7982 Long term (current) use of aspirin: Secondary | ICD-10-CM | POA: Insufficient documentation

## 2015-10-31 DIAGNOSIS — Z01818 Encounter for other preprocedural examination: Secondary | ICD-10-CM | POA: Diagnosis not present

## 2015-10-31 DIAGNOSIS — K4090 Unilateral inguinal hernia, without obstruction or gangrene, not specified as recurrent: Secondary | ICD-10-CM | POA: Diagnosis not present

## 2015-10-31 DIAGNOSIS — Z79899 Other long term (current) drug therapy: Secondary | ICD-10-CM | POA: Diagnosis not present

## 2015-10-31 DIAGNOSIS — I739 Peripheral vascular disease, unspecified: Secondary | ICD-10-CM | POA: Insufficient documentation

## 2015-10-31 HISTORY — DX: Myoneural disorder, unspecified: G70.9

## 2015-10-31 LAB — BASIC METABOLIC PANEL
Anion gap: 5 (ref 5–15)
BUN: 27 mg/dL — AB (ref 6–20)
CALCIUM: 9.6 mg/dL (ref 8.9–10.3)
CO2: 27 mmol/L (ref 22–32)
CREATININE: 1.42 mg/dL — AB (ref 0.61–1.24)
Chloride: 104 mmol/L (ref 101–111)
GFR calc Af Amer: 55 mL/min — ABNORMAL LOW (ref >60)
GFR, EST NON AFRICAN AMERICAN: 48 mL/min — AB (ref >60)
GLUCOSE: 106 mg/dL — AB (ref 65–99)
Potassium: 4.2 mmol/L (ref 3.5–5.1)
Sodium: 136 mmol/L (ref 135–145)

## 2015-10-31 LAB — CBC
HCT: 41.8 % (ref 39.0–52.0)
HEMOGLOBIN: 14.2 g/dL (ref 13.0–17.0)
MCH: 31.6 pg (ref 26.0–34.0)
MCHC: 34 g/dL (ref 30.0–36.0)
MCV: 92.9 fL (ref 78.0–100.0)
Platelets: 180 10*3/uL (ref 150–400)
RBC: 4.5 MIL/uL (ref 4.22–5.81)
RDW: 12.2 % (ref 11.5–15.5)
WBC: 7.7 10*3/uL (ref 4.0–10.5)

## 2015-10-31 NOTE — Progress Notes (Signed)
PCP is Dr. Burton Apleyonald Roberts Cardiologist is Dr. Sondra Comeiley states he saw him 8 months to1 year ago. Request sent for last office visit, Stress test, and echo.- Mr Richard FellersFrank states the stress test was fine and no further work up needed.

## 2015-10-31 NOTE — Pre-Procedure Instructions (Signed)
Braxtyn Madrazo  10/31/2015      [facility] [id number] Ginette Otto- [place], [place] - [address] AT [facility] [address] [facility] [name][address] [place] [id number] Phone: [phone] Fax: [phone]    Your procedure is scheduled on Monday July 10th  [facility]:30 [facility].M.  Call this number if you have problems the morning of surgery:  [phone]   Remember:  Do not eat food or drink liquids after midnight.   Take these medicines the morning of surgery with A SIP OF WATER Bupropion (wellbutrin), Buspirone (Buspar), cetirizine (Zyrtec), esomeprazole (nexium), terazosin 9hytrin), triamcinolone (nasacort),   7 days prior to surgery STOP taking any Aspirin, Aleve, Naproxen, Ibuprofen, Motrin, Advil, Goody's, BC's, all herbal medications, fish oil, and all vitamins    Do not wear jewelry, make-up, or nail polish.  Do not wear lotions, powders, or Colognes.  You may NOT wear deoderant.    Men may shave face and neck.  Do not bring valuables to the hospital.  [facility] is not responsible for any belongings or valuables.  Contacts, dentures or bridgework may not be worn into surgery.  Leave your suitcase in the car.  After surgery it may be brought to your room.  For patients admitted to the hospital, discharge time will be determined by your treatment team.  Patients discharged the day of surgery will not be allowed to drive home.    Special instructions:   [facility]- Preparing For Surgery  Before surgery, you can play an important role. Because skin is not sterile, your skin needs to be as free of germs as possible. You can reduce the number of germs on your skin by washing with CHG (chlorahexidine gluconate) Soap before surgery.  CHG is an antiseptic cleaner which kills germs and bonds with the skin to continue killing germs even after washing.  Please do not use if you have an allergy to CHG or antibacterial soaps. If your skin becomes  reddened/irritated stop using the CHG.  Do not shave (including legs and underarms) for at least 48 hours prior to first CHG shower. It is OK to shave your face.  Please follow these instructions carefully.   1. Shower the NIGHT BEFORE SURGERY and the MORNING OF SURGERY with CHG.   2. If you chose to wash your hair, wash your hair first as usual with your normal shampoo.  3. After you shampoo, rinse your hair and body thoroughly to remove the shampoo.  4. Use CHG as you would any other liquid soap. You can apply CHG directly to the skin and wash gently with a scrungie or a clean washcloth.   5. Apply the CHG Soap to your body ONLY FROM THE NECK DOWN.  Do not use on open wounds or open sores. Avoid contact with your eyes, ears, mouth and genitals (private parts). Wash genitals (private parts) with your normal soap.  6. Wash thoroughly, paying special attention to the area where your surgery will be performed.  7. Thoroughly rinse your body with warm water from the neck down.  8. DO NOT shower/wash with your normal soap after using and rinsing off the CHG Soap.  9. Pat yourself dry with a CLEAN TOWEL.   10. Wear CLEAN PAJAMAS   11. Place CLEAN SHEETS on your bed the night of your first shower and DO NOT SLEEP WITH PETS.    Day of Surgery: Do not  apply any deodorants/lotions. Please wear clean clothes to the hospital/surgery center.      Please read over the following fact sheets that you were given. Pain Booklet and Surgical Site Infection Prevention

## 2015-10-31 NOTE — Progress Notes (Addendum)
Called Dr Carmie KannerHoxworths office for clarification about medication orders pre-op.  Questioned Celebrex 400mg  ordered.  Patient takes 200mg  celebrex at home.    Awaiting for call back from office

## 2015-11-01 NOTE — Progress Notes (Signed)
Anesthesia Chart Review:  Pt is a 75 year old male scheduled for open repair of L inguinal hernia on 11/13/2015 with Glenna FellowsBenjamin Hoxworth, MD.   Cardiologist is Viann FishSpencer Tilley, MD; last office visit 11/16/14, f/u recommended in 1 year.   PMH includes:  HTN, PVD, atrial fibrillation (x1 many years ago, no recurrence), mild aortic root enlargement (by 11/28/14 echo). Never smoker. BMI 28.5. S/p lumbar laminectomy 02/10/14.   Medications include: ASA, nexium, hctz, lisinopril, terazosin  Preoperative labs reviewed.    EKG 10/31/15: NSR. Left axis deviation. Minimal voltage criteria for LVH, may be normal variant  Exercise tolerance test 12/07/14:  1. Clinically and EKG negative for ischemia on adequate exercise treadmill test 2. Good exercise capacity for age  Echo 11/28/14:  1. Mild concentric LVH with normal LV systolic function. EF 55% 2. Doppler evidence grade I diastolic dysfunction 3. Trace MR and PR 4. Mild TR 5. Mild aortic root enlargement 6. 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   Rica Mastngela Yazleen Molock, FNP-BC [facility] Phone: [phone] 11/01/2015 3:27 PM

## 2015-11-10 MED ORDER — CELECOXIB 200 MG PO CAPS
400.0000 mg | ORAL_CAPSULE | ORAL | Status: AC
  Administered 2015-11-13: 400 mg via ORAL
  Filled 2015-11-10: qty 2

## 2015-11-10 MED ORDER — CIPROFLOXACIN IN D5W 400 MG/200ML IV SOLN
400.0000 mg | INTRAVENOUS | Status: AC
  Administered 2015-11-13: 400 mg via INTRAVENOUS
  Filled 2015-11-10: qty 200

## 2015-11-10 MED ORDER — ACETAMINOPHEN 500 MG PO TABS
1000.0000 mg | ORAL_TABLET | ORAL | Status: AC
  Administered 2015-11-13: 1000 mg via ORAL
  Filled 2015-11-10: qty 2

## 2015-11-10 MED ORDER — GABAPENTIN 300 MG PO CAPS
300.0000 mg | ORAL_CAPSULE | ORAL | Status: AC
  Administered 2015-11-13: 300 mg via ORAL
  Filled 2015-11-10: qty 1

## 2015-11-12 NOTE — H&P (Signed)
  History of Present Illness   He has  a history of laparoscopic repair of bilateral inguinal hernia, direct, left larger than right in October 2016. He was found to have an early recurrence, small and minimally symptomatic on the left. He returns for more long-term follow-up. He has had 1 episode of some aching in the area but essentially asymptomatic. After discussion we have elected to proceed with repair.  Past Medical History  Diagnosis Date  . Hypertension   . 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 Peripheral vascular disease (HCC)     surface blood clot x1 top of left foot  . Pneumonia     years ago 5,  . Depression   . Anxiety   . GERD (gastroesophageal reflux disease)     on meds  . Arthritis   . BPH (benign prostatic hypertrophy)     on meds  . Enlarged aorta (HCC)   . Neuromuscular disorder (HCC)      right groin with numbness states he had back surgery in hopes to correct this, but still there    Past Surgical History  Procedure Laterality Date  . Eye surgery Bilateral     cataracts  . Lumbar laminectomy/decompression microdiscectomy N/A 02/10/2014    Procedure: Lumbar One to Two, Lumbar Two to Three Lumbar laminectomy for stenosis/epidural mass;  Surgeon: Tia Alertavid S Jones, MD;  Location: [facility];  Service: Neurosurgery;  Laterality: N/A;  L1-2 L2-3 Lumbar laminectomy for stenosis/epidural mass  . Hernia repair    . Back surgery     Allergies  Amoxicillin *PENICILLINS* Codeine Phosphate *ANALGESICS - OPIOID* Oxycodone-Acetaminophen *ANALGESICS - OPIOID* Hives.  Medication History  Aspirin (81MG  Tablet Chewable, Oral) Active. Lisinopril (20MG  Tablet, Oral) Active. Montelukast Sodium (10MG  Tablet, Oral) Active. CeleBREX (200MG  Capsule, Oral) Active. Terazosin HCl (2MG  Capsule, Oral) Active. BuSpar (10MG  Tablet, Oral) Active. BusPIRone HCl (10MG  Tablet, Oral) Active. Azelastine HCl (0.1% Solution, Nasal) Active. NexIUM (20MG   Capsule DR, Oral) Active. Medications Reconciled  Vitals   Weight: 208 lb Height: 72in Body Surface Area: 2.17 m Body Mass Index: 28.21 kg/m  Temp.: 97.22F  Pulse: 80 (Regular)  BP: 130/68 (Sitting, Left Arm, Standard)   Physical Exam  General: Alert, well-developed Caucasian male, in no distress Skin: Warm and dry without rash or infection. HEENT: No palpable masses or thyromegaly. Sclera nonicteric.  Lungs: Breath sounds clear and equal without increased work of breathing Cardiovascular: Regular rate and rhythm without murmur. No JVD or edema Abdomen: Nondistended. Soft and nontender. No masses palpable. No organomegaly. Reducible small/moderate LIH Extremities: No edema or joint swelling or deformity.  Neurologic: Alert and fully oriented. Gait normal.   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

## 2015-11-13 ENCOUNTER — Ambulatory Visit (HOSPITAL_COMMUNITY): Payer: Medicare Other | Admitting: Emergency Medicine

## 2015-11-13 ENCOUNTER — Encounter (HOSPITAL_COMMUNITY): Payer: Self-pay | Admitting: Anesthesiology

## 2015-11-13 ENCOUNTER — Encounter (HOSPITAL_COMMUNITY)
Admission: RE | Disposition: A | Discharge status: Home / Self Care | Payer: Self-pay | Source: Ambulatory Visit | Attending: General Surgery

## 2015-11-13 ENCOUNTER — Ambulatory Visit (HOSPITAL_COMMUNITY): Payer: Medicare Other | Admitting: Anesthesiology

## 2015-11-13 ENCOUNTER — Observation Stay (HOSPITAL_COMMUNITY)
Admission: RE | Admit: 2015-11-13 | Discharge: 2015-11-13 | Disposition: A | Discharge status: Home / Self Care | Payer: Medicare Other | Source: Ambulatory Visit | Attending: General Surgery | Admitting: General Surgery

## 2015-11-13 DIAGNOSIS — F418 Other specified anxiety disorders: Secondary | ICD-10-CM | POA: Diagnosis not present

## 2015-11-13 DIAGNOSIS — Z880 Allergy status to penicillin: Secondary | ICD-10-CM | POA: Insufficient documentation

## 2015-11-13 DIAGNOSIS — M1990 Unspecified osteoarthritis, unspecified site: Secondary | ICD-10-CM | POA: Diagnosis not present

## 2015-11-13 DIAGNOSIS — N400 Enlarged prostate without lower urinary tract symptoms: Secondary | ICD-10-CM | POA: Insufficient documentation

## 2015-11-13 DIAGNOSIS — K219 Gastro-esophageal reflux disease without esophagitis: Secondary | ICD-10-CM | POA: Diagnosis not present

## 2015-11-13 DIAGNOSIS — K4091 Unilateral inguinal hernia, without obstruction or gangrene, recurrent: Secondary | ICD-10-CM | POA: Diagnosis present

## 2015-11-13 DIAGNOSIS — I10 Essential (primary) hypertension: Secondary | ICD-10-CM | POA: Insufficient documentation

## 2015-11-13 DIAGNOSIS — I739 Peripheral vascular disease, unspecified: Secondary | ICD-10-CM | POA: Insufficient documentation

## 2015-11-13 HISTORY — PX: INGUINAL HERNIA REPAIR: SHX194

## 2015-11-13 SURGERY — REPAIR, HERNIA, INGUINAL, ADULT
Anesthesia: Choice | Site: Groin | Laterality: Left

## 2015-11-13 MED ORDER — PHENYLEPHRINE HCL 10 MG/ML IJ SOLN
INTRAMUSCULAR | Status: DC | PRN
  Administered 2015-11-13 (×3): 40 ug via INTRAVENOUS

## 2015-11-13 MED ORDER — FENTANYL CITRATE (PF) 100 MCG/2ML IJ SOLN
25.0000 ug | INTRAMUSCULAR | Status: DC | PRN
  Administered 2015-11-13 (×2): 50 ug via INTRAVENOUS

## 2015-11-13 MED ORDER — FENTANYL CITRATE (PF) 250 MCG/5ML IJ SOLN
INTRAMUSCULAR | Status: AC
  Filled 2015-11-13: qty 5

## 2015-11-13 MED ORDER — BUPIVACAINE-EPINEPHRINE (PF) 0.5% -1:200000 IJ SOLN
INTRAMUSCULAR | Status: AC
  Filled 2015-11-13: qty 30

## 2015-11-13 MED ORDER — LIDOCAINE HCL (CARDIAC) 20 MG/ML IV SOLN
INTRAVENOUS | Status: DC | PRN
  Administered 2015-11-13: 50 mg via INTRAVENOUS

## 2015-11-13 MED ORDER — OXYCODONE HCL 5 MG PO TABS
ORAL_TABLET | ORAL | Status: AC
  Filled 2015-11-13: qty 1

## 2015-11-13 MED ORDER — ACETAMINOPHEN 325 MG PO TABS: 325.0000 mg | ORAL_TABLET | ORAL | Status: DC | PRN

## 2015-11-13 MED ORDER — ONDANSETRON HCL 4 MG/2ML IJ SOLN
INTRAMUSCULAR | Status: AC
  Filled 2015-11-13: qty 2

## 2015-11-13 MED ORDER — PHENYLEPHRINE 40 MCG/ML (10ML) SYRINGE FOR IV PUSH (FOR BLOOD PRESSURE SUPPORT)
PREFILLED_SYRINGE | INTRAVENOUS | Status: AC
  Filled 2015-11-13: qty 10

## 2015-11-13 MED ORDER — CHLORHEXIDINE GLUCONATE CLOTH 2 % EX PADS: 6.0000 | MEDICATED_PAD | Freq: Once | CUTANEOUS | Status: DC

## 2015-11-13 MED ORDER — LIDOCAINE 2% (20 MG/ML) 5 ML SYRINGE
INTRAMUSCULAR | Status: AC
  Filled 2015-11-13: qty 5

## 2015-11-13 MED ORDER — MIDAZOLAM HCL 2 MG/2ML IJ SOLN
INTRAMUSCULAR | Status: AC
  Filled 2015-11-13: qty 2

## 2015-11-13 MED ORDER — LACTATED RINGERS IV SOLN
INTRAVENOUS | Status: DC
  Administered 2015-11-13 (×2): via INTRAVENOUS

## 2015-11-13 MED ORDER — HYDROMORPHONE HCL 4 MG PO TABS: 2.0000 mg | ORAL_TABLET | Freq: Four times a day (QID) | ORAL | Status: DC | PRN

## 2015-11-13 MED ORDER — MIDAZOLAM HCL 5 MG/5ML IJ SOLN
INTRAMUSCULAR | Status: DC | PRN
  Administered 2015-11-13 (×2): 1 mg via INTRAVENOUS

## 2015-11-13 MED ORDER — METHOCARBAMOL 500 MG PO TABS
500.0000 mg | ORAL_TABLET | Freq: Four times a day (QID) | ORAL | Status: DC | PRN
  Administered 2015-11-13: 500 mg via ORAL
  Filled 2015-11-13: qty 1

## 2015-11-13 MED ORDER — OXYCODONE HCL 5 MG PO TABS
5.0000 mg | ORAL_TABLET | Freq: Once | ORAL | Status: AC | PRN
  Administered 2015-11-13: 5 mg via ORAL

## 2015-11-13 MED ORDER — PROPOFOL 10 MG/ML IV BOLUS
INTRAVENOUS | Status: DC | PRN
  Administered 2015-11-13: 200 mg via INTRAVENOUS

## 2015-11-13 MED ORDER — FENTANYL CITRATE (PF) 100 MCG/2ML IJ SOLN
INTRAMUSCULAR | Status: DC | PRN
  Administered 2015-11-13 (×3): 50 ug via INTRAVENOUS

## 2015-11-13 MED ORDER — OXYCODONE HCL 5 MG/5ML PO SOLN: 5.0000 mg | Freq: Once | ORAL | Status: AC | PRN

## 2015-11-13 MED ORDER — METHOCARBAMOL 500 MG PO TABS: 500.0000 mg | ORAL_TABLET | Freq: Four times a day (QID) | ORAL | Status: DC | PRN

## 2015-11-13 MED ORDER — BUPIVACAINE-EPINEPHRINE (PF) 0.5% -1:200000 IJ SOLN
INTRAMUSCULAR | Status: DC | PRN
  Administered 2015-11-13: 30 mL via PERINEURAL

## 2015-11-13 MED ORDER — BUPIVACAINE-EPINEPHRINE 0.5% -1:200000 IJ SOLN
INTRAMUSCULAR | Status: DC | PRN
  Administered 2015-11-13: 10 mL

## 2015-11-13 MED ORDER — METHOCARBAMOL 500 MG PO TABS
ORAL_TABLET | ORAL | Status: AC
  Filled 2015-11-13: qty 1

## 2015-11-13 MED ORDER — 0.9 % SODIUM CHLORIDE (POUR BTL) OPTIME
TOPICAL | Status: DC | PRN
  Administered 2015-11-13: 1000 mL

## 2015-11-13 MED ORDER — ONDANSETRON HCL 4 MG/2ML IJ SOLN
INTRAMUSCULAR | Status: DC | PRN
  Administered 2015-11-13: 4 mg via INTRAVENOUS

## 2015-11-13 MED ORDER — ACETAMINOPHEN 160 MG/5ML PO SOLN
325.0000 mg | ORAL | Status: DC | PRN
  Filled 2015-11-13: qty 20.3

## 2015-11-13 MED ORDER — FENTANYL CITRATE (PF) 100 MCG/2ML IJ SOLN
INTRAMUSCULAR | Status: AC
  Filled 2015-11-13: qty 2

## 2015-11-13 SURGICAL SUPPLY — 53 items
BLADE SURG 10 STRL SS (BLADE) ×3 IMPLANT
BLADE SURG 15 STRL LF DISP TIS (BLADE) ×1 IMPLANT
BLADE SURG 15 STRL SS (BLADE) ×2
BLADE SURG ROTATE 9660 (MISCELLANEOUS) ×3 IMPLANT
CHLORAPREP W/TINT 26ML (MISCELLANEOUS) ×3 IMPLANT
COVER SURGICAL LIGHT HANDLE (MISCELLANEOUS) ×3 IMPLANT
DECANTER SPIKE VIAL GLASS SM (MISCELLANEOUS) ×3 IMPLANT
DERMABOND ADVANCED (GAUZE/BANDAGES/DRESSINGS) ×2
DERMABOND ADVANCED .7 DNX12 (GAUZE/BANDAGES/DRESSINGS) ×1 IMPLANT
DRAIN PENROSE 1/2X12 LTX STRL (WOUND CARE) ×3 IMPLANT
DRAPE INCISE IOBAN 66X45 STRL (DRAPES) ×3 IMPLANT
DRAPE LAPAROTOMY TRNSV 102X78 (DRAPE) ×3 IMPLANT
DRAPE UTILITY XL STRL (DRAPES) ×6 IMPLANT
ELECT CAUTERY BLADE 6.4 (BLADE) ×3 IMPLANT
ELECT REM PT RETURN 9FT ADLT (ELECTROSURGICAL) ×3
ELECTRODE REM PT RTRN 9FT ADLT (ELECTROSURGICAL) ×1 IMPLANT
GLOVE BIOGEL PI IND STRL 7.0 (GLOVE) ×2 IMPLANT
GLOVE BIOGEL PI IND STRL 8 (GLOVE) ×1 IMPLANT
GLOVE BIOGEL PI INDICATOR 7.0 (GLOVE) ×4
GLOVE BIOGEL PI INDICATOR 8 (GLOVE) ×2
GLOVE ECLIPSE 7.5 STRL STRAW (GLOVE) ×6 IMPLANT
GLOVE SURG SS PI 7.0 STRL IVOR (GLOVE) ×3 IMPLANT
GOWN STRL REUS W/ TWL LRG LVL3 (GOWN DISPOSABLE) ×2 IMPLANT
GOWN STRL REUS W/ TWL XL LVL3 (GOWN DISPOSABLE) ×1 IMPLANT
GOWN STRL REUS W/TWL LRG LVL3 (GOWN DISPOSABLE) ×4
GOWN STRL REUS W/TWL XL LVL3 (GOWN DISPOSABLE) ×2
KIT BASIN OR (CUSTOM PROCEDURE TRAY) ×3 IMPLANT
KIT ROOM TURNOVER OR (KITS) ×3 IMPLANT
LIQUID BAND (GAUZE/BANDAGES/DRESSINGS) ×3 IMPLANT
MESH PROLENE 3X6 (Mesh General) ×3 IMPLANT
NEEDLE HYPO 25GX1X1/2 BEV (NEEDLE) ×3 IMPLANT
NS IRRIG 1000ML POUR BTL (IV SOLUTION) ×3 IMPLANT
PACK SURGICAL SETUP 50X90 (CUSTOM PROCEDURE TRAY) ×3 IMPLANT
PAD ARMBOARD 7.5X6 YLW CONV (MISCELLANEOUS) ×3 IMPLANT
PENCIL BUTTON HOLSTER BLD 10FT (ELECTRODE) ×3 IMPLANT
SPECIMEN JAR SMALL (MISCELLANEOUS) IMPLANT
SPONGE LAP 4X18 X RAY DECT (DISPOSABLE) ×6 IMPLANT
SUT MON AB 4-0 PC3 18 (SUTURE) ×3 IMPLANT
SUT PROLENE 2 0 CT2 30 (SUTURE) ×6 IMPLANT
SUT SILK 2 0 SH (SUTURE) ×3 IMPLANT
SUT SILK 3 0 (SUTURE)
SUT SILK 3-0 18XBRD TIE 12 (SUTURE) IMPLANT
SUT VIC AB 3-0 54X BRD REEL (SUTURE) ×1 IMPLANT
SUT VIC AB 3-0 BRD 54 (SUTURE) ×2
SUT VIC AB 3-0 CT1 27 (SUTURE) ×4
SUT VIC AB 3-0 CT1 TAPERPNT 27 (SUTURE) ×2 IMPLANT
SYR CONTROL 10ML LL (SYRINGE) ×3 IMPLANT
TOWEL OR 17X24 6PK STRL BLUE (TOWEL DISPOSABLE) IMPLANT
TOWEL OR 17X26 10 PK STRL BLUE (TOWEL DISPOSABLE) ×3 IMPLANT
TUBE CONNECTING 12'X1/4 (SUCTIONS) ×1
TUBE CONNECTING 12X1/4 (SUCTIONS) ×2 IMPLANT
TUBING BULK SUCTION (MISCELLANEOUS) ×3 IMPLANT
YANKAUER SUCT BULB TIP NO VENT (SUCTIONS) ×3 IMPLANT

## 2015-11-13 NOTE — Progress Notes (Signed)
Pt ambulating several laps around PACU, tol very well. Able to void & tol po fluids well. He wants to go home & wife in agreement. Dr Luisa Hartornett (on call for Hoxworth) notified pt DC to home.

## 2015-11-13 NOTE — Anesthesia Procedure Notes (Addendum)
Procedure Name: LMA Insertion Date/Time: 11/13/2015 3:50 PM Performed by: Darcey NoraJAMES, KAREN B Pre-anesthesia Checklist: Patient identified, Emergency Drugs available, Suction available and Patient being monitored Patient Re-evaluated:Patient Re-evaluated prior to inductionOxygen Delivery Method: Circle system utilized Preoxygenation: Pre-oxygenation with 100% oxygen Intubation Type: IV induction Ventilation: Mask ventilation without difficulty LMA: LMA inserted LMA Size: 5.0 Number of attempts: 1 Placement Confirmation: positive ETCO2,  breath sounds checked- equal and bilateral and ETT inserted through vocal cords under direct vision Tube secured with: Tape (taped across cheeks) Dental Injury: Teeth and Oropharynx as per pre-operative assessment    Anesthesia Regional Block:  TAP block  Pre-Anesthetic Checklist: ,, timeout performed, Correct Patient, Correct Site, Correct Laterality, Correct Procedure, Correct Position, risks and benefits discussed, surgical consent, pre-op evaluation,  At surgeon's request and post-op pain management  Laterality: N/A and Left  Prep: chloraprep       Needles:  Injection technique: Single-shot  Needle Type: Echogenic Needle          Additional Needles:  Procedures: ultrasound guided (picture in chart) TAP block Narrative:  Injection made incrementally with aspirations every 5 mL.  Performed by: Personally   Additional Notes: H+P and labs reviewed, risks and benefits discussed with patient, procedure tolerated well without complications

## 2015-11-13 NOTE — Op Note (Signed)
Preoperative Diagnosis: recurrent left inguinal hernia   Postoprative Diagnosis: recurrent left inguinal hernia   Procedure: Procedure(s): OPEN REPAIR LEFT INGUINAL HERNIA with mesh   Surgeon: Glenna FellowsHoxworth, Nathanal Hermiz T   Assistants: none  Anesthesia:  General LMA anesthesia  Indications: patient is a 75 year old male status post laparoscopic bilateral inguinal hernia repair for indirect hernias with an early postoperative recurrence of a small hernia on the left side which has gradually enlarged and become more symptomatic. Following extensive preoperative discussion regarding indications and nature of surgery and risks have recommended open repairwith an anterior approach with mesh. He is now brought to the operating room for this procedure.    Procedure Detail:  Patient was brought to the operating room, placed in the supine position on the operating table, and laryngeal mask general anesthesia induced. He underwenta TA P block by anesthesia.He was given preoperative IV antibiotics. The left groin and abdomen were widely sterilely prepped and draped. Patient timeout was performed and correct procedure verified. An oblique incision was made in the left groin and dissection carried down through the subcutaneous tissue and Scarpa's fascia using cautery. The external oblique was identified and it was divided along the lines of its fibers through the external ring. The cord was dissected up off the floor of the inguinal canal at the pubic tubercle. The cord was mobilized up toward the internal ring. Cremasterics fibers were divided bilaterally.The floor of the inguinal canal appeared solid. There was a fair amount of scarring around the internal ring apparently from previous laparoscopic repair. Dissection in the cord at the internal ring identified an indirect hernia sac, moderate-sized which was dissected away from cord structures and mobilized up into the internal ring.  I could feel the previously  placed laparoscopic mesh through the internal ring with its lower edge just above the internal ring and likelythe inferior edge had flippedsuperiorly.There was some scarring of the sac around the internal ring and cord which was carefullytaken down and mobilized the sac as high as I could get at which point it was suture ligated with 2-0 silk and divided and removed. The internal ring was somewhat dilated. A piece of Prolene mesh was trimmed to size to fit the floor the inguinal canal with tails to go around the cord at the internal ring. Long tails were fashioned. The mesh was sutured initially to the pubic tubercle and then working medial to lateral to the iliopubic tract and inguinal ligament with running 2-0 Prolene until it was well lateral to the internal ring. Medially the mesh was sutured to theinternal oblique and edge of the rectus sheath with interrupted 2-0 Prolene working up above the internal ring.The tails of the mesh were then sutured together lateral to the internal ring forming a snug but not strangulating opening for the cord. The operative site was inspected for hemostasis which appeared complete. The cord was returned to its anatomic position. The external oblique was closed with running 3-0 Vicryl. Scarpa's fascia was closed with running 3-0 Vicryl and the skin with running subcuticular 4-0 Monocryl and Dermabond. Sponge needle and instrument counts were correct.    Findings: As above  Estimated Blood Loss:  less than 50 mL         Drains: none  Blood Given: none          Specimens: none        Complications:  * No complications entered in OR log *         Disposition: PACU -  hemodynamically stable.         Condition: stable

## 2015-11-13 NOTE — Progress Notes (Signed)
Care of pt assumed by MA Collene MaresShaver RN Spouse at bedside. Pt has Bair Hugger on because he was shivering upon arrival to PACU. He is saying pain is much decreased now, but wants to stay over because he wants to be "kept warm". Wife fully updated.

## 2015-11-13 NOTE — Anesthesia Preprocedure Evaluation (Addendum)
Anesthesia Evaluation  Patient identified by MRN, date of birth, ID band Patient awake    Reviewed: Allergy & Precautions, NPO status , Patient's Chart, lab work & pertinent test results, reviewed documented beta blocker date and time   Airway Mallampati: II  TM Distance: >3 FB Neck ROM: Full    Dental  (+) Teeth Intact, Dental Advisory Given   Pulmonary neg pulmonary ROS,    breath sounds clear to auscultation       Cardiovascular hypertension, Pt. on medications + Peripheral Vascular Disease  + dysrhythmias  Rhythm:Regular     Neuro/Psych neg Seizures PSYCHIATRIC DISORDERS Anxiety Depression  Neuromuscular disease    GI/Hepatic Neg liver ROS, GERD  Medicated and Controlled,  Endo/Other  negative endocrine ROS  Renal/GU negative Renal ROS     Musculoskeletal  (+) Arthritis ,   Abdominal   Peds  Hematology negative hematology ROS (+)   Anesthesia Other Findings   Reproductive/Obstetrics                           Anesthesia Physical Anesthesia Plan  ASA: II  Anesthesia Plan: General and Regional   Post-op Pain Management:    Induction: Intravenous  Airway Management Planned: LMA  Additional Equipment: None  Intra-op Plan:   Post-operative Plan: Extubation in OR  Informed Consent: I have reviewed the patients History and Physical, chart, labs and discussed the procedure including the risks, benefits and alternatives for the proposed anesthesia with the patient or authorized representative who has indicated his/her understanding and acceptance.   Dental advisory given  Plan Discussed with: CRNA and Surgeon  Anesthesia Plan Comments:         Anesthesia Quick Evaluation

## 2015-11-13 NOTE — Discharge Instructions (Signed)
[facility] _______[facility]  UMBILICAL OR INGUINAL HERNIA REPAIR: POST OP INSTRUCTIONS  Always review your discharge instruction sheet given to you by the facility where your surgery was performed. IF YOU HAVE DISABILITY OR FAMILY LEAVE FORMS, YOU MUST BRING THEM TO THE OFFICE FOR PROCESSING.   DO NOT GIVE THEM TO YOUR DOCTOR.  1. A  prescription for pain medication may be given to you upon discharge.  Take your pain medication as prescribed, if needed.  If narcotic pain medicine is not needed, then you may take acetaminophen (Tylenol) or ibuprofen (Advil) as needed. 2. Take your usually prescribed medications unless otherwise directed. 3. If you need a refill on your pain medication, please contact your pharmacy.  They will contact our office to request authorization. Prescriptions will not be filled after 5 pm or on week-ends. 4. You should follow a light diet the first 24 hours after arrival home, such as soup and crackers, etc.  Be sure to include lots of fluids daily.  Resume your normal diet the day after surgery. 5. Most patients will experience some swelling and bruising around the umbilicus or in the groin and scrotum.  Ice packs and reclining will help.  Swelling and bruising can take several days to resolve.  6. It is common to experience some constipation if taking pain medication after surgery.  Increasing fluid intake and taking a stool softener (such as Colace) will usually help or prevent this problem from occurring.  A mild laxative (Milk of Magnesia or Miralax) should be taken according to package directions if there are no bowel movements after 48 hours. 7. Unless discharge instructions indicate otherwise, you may remove your bandages 24-48 hours after surgery, and you may shower at that time.  You may have steri-strips (small skin tapes) in place directly over the incision.  These strips should be left on the skin for 7-10 days.  If your surgeon used skin glue on the  incision, you may shower in 24 hours.  The glue will flake off over the next 2-3 weeks.  Any sutures or staples will be removed at the office during your follow-up visit. 8. ACTIVITIES:  You may resume regular (light) daily activities beginning the next day--such as daily self-care, walking, climbing stairs--gradually increasing activities as tolerated.  You may have sexual intercourse when it is comfortable.  Refrain from any heavy lifting or straining until approved by your doctor. a. You may drive when you are no longer taking prescription pain medication, you can comfortably wear a seatbelt, and you can safely maneuver your car and apply brakes. b. RETURN TO WORK:  __________________________________________________________ 9. You should see your doctor in the office for a follow-up appointment approximately 2-3 weeks after your surgery.  Make sure that you call for this appointment within a day or two after you arrive home to insure a convenient appointment time. 10. OTHER INSTRUCTIONS:  __________________________________________________________________________________________________________________________________________________________________________________________  WHEN TO CALL YOUR DOCTOR: 1. Fever over 101.0 2. Inability to urinate 3. Nausea and/or vomiting 4. Extreme swelling or bruising 5. Continued bleeding from incision. 6. Increased pain, redness, or drainage from the incision  The clinic staff is available to answer your questions during regular business hours.  Please don't hesitate to call and ask to speak to one of the nurses for clinical concerns.  If you have a medical emergency, go to the nearest emergency room or call 911.  A surgeon from [facility] is always on call at the hospital     [address], [address], [place], [place]  [address] ?  [address] [address], [place], [place]   [address] [phone] ? [phone] ? FAX [phone] Web site:  [email or web address]  

## 2015-11-13 NOTE — Interval H&P Note (Signed)
History and Physical Interval Note:  11/13/2015 3:32 PM  Richard Williams  has presented today for surgery, with the diagnosis of recurrent left inguinal hernia   The various methods of treatment have been discussed with the patient and family. After consideration of risks, benefits and other options for treatment, the patient has consented to  Procedure(s): OPEN REPAIR LEFT INGUINAL HERNIA (Left) as a surgical intervention .  The patient's history has been reviewed, patient examined, no change in status, stable for surgery.  I have reviewed the patient's chart and labs.  Questions were answered to the patient's satisfaction.     Britany Callicott T

## 2015-11-13 NOTE — Transfer of Care (Signed)
Immediate Anesthesia Transfer of Care Note  Patient: Richard BrawnRobert Reeg  Procedure(s) Performed: Procedure(s): OPEN REPAIR LEFT INGUINAL HERNIA (Left)  Patient Location: PACU  Anesthesia Type:General  Level of Consciousness: awake and patient cooperative  Airway & Oxygen Therapy: Patient Spontanous Breathing and Patient connected to nasal cannula oxygen  Post-op Assessment: Report given to RN, Post -op Vital signs reviewed and stable and Patient moving all extremities  Post vital signs: Reviewed and stable  Last Vitals:  Filed Vitals:   11/13/15 1750  Pulse: 67  Temp: 36.4 C  Resp: 15    Last Pain: There were no vitals filed for this visit.       Complications: No apparent anesthesia complications

## 2015-11-14 ENCOUNTER — Encounter (HOSPITAL_COMMUNITY): Payer: Self-pay | Admitting: General Surgery

## 2015-11-14 NOTE — Anesthesia Postprocedure Evaluation (Signed)
Anesthesia Post Note  Patient: Richard BrawnRobert Williams  Procedure(s) Performed: Procedure(s) (LRB): OPEN REPAIR LEFT INGUINAL HERNIA (Left)  Patient location during evaluation: PACU Anesthesia Type: General and Regional Level of consciousness: awake Pain management: pain level controlled Vital Signs Assessment: post-procedure vital signs reviewed and stable Respiratory status: spontaneous breathing Cardiovascular status: stable Postop Assessment: no signs of nausea or vomiting Anesthetic complications: no    Last Vitals:  Filed Vitals:   11/13/15 1926 11/13/15 1930  BP:    Pulse: 93   Temp:  36.6 C  Resp: 16     Last Pain:  Filed Vitals:   11/13/15 2037  PainSc: 3                  Wofford Stratton

## 2016-03-18 ENCOUNTER — Telehealth: Payer: Self-pay

## 2016-03-18 NOTE — Telephone Encounter (Signed)
[phone]  PATIENT CALLED TO SCHEDULE A TCS, NEW PT WANTS RMR

## 2016-03-21 NOTE — Telephone Encounter (Signed)
I called pt and he said he had a physical yesterday and his doctor has scheduled him for a colonoscopy with another GI doctor.

## 2017-02-19 ENCOUNTER — Other Ambulatory Visit: Payer: Self-pay | Admitting: Student

## 2017-02-19 DIAGNOSIS — M545 Low back pain, unspecified: Secondary | ICD-10-CM

## 2017-02-21 ENCOUNTER — Ambulatory Visit
Admission: RE | Admit: 2017-02-21 | Discharge: 2017-02-21 | Disposition: A | Discharge status: Home / Self Care | Payer: Medicare Other | Source: Ambulatory Visit | Attending: Student | Admitting: Student

## 2017-02-21 DIAGNOSIS — M545 Low back pain, unspecified: Secondary | ICD-10-CM

## 2017-02-25 ENCOUNTER — Other Ambulatory Visit: Payer: Self-pay | Admitting: Neurological Surgery

## 2017-03-11 ENCOUNTER — Other Ambulatory Visit: Payer: Self-pay

## 2017-03-11 ENCOUNTER — Encounter (HOSPITAL_COMMUNITY): Payer: Self-pay

## 2017-03-11 ENCOUNTER — Ambulatory Visit (HOSPITAL_COMMUNITY)
Admission: RE | Admit: 2017-03-11 | Discharge: 2017-03-11 | Disposition: A | Discharge status: Home / Self Care | Payer: Medicare Other | Source: Ambulatory Visit | Attending: Neurological Surgery | Admitting: Neurological Surgery

## 2017-03-11 ENCOUNTER — Encounter (HOSPITAL_COMMUNITY)
Admission: RE | Admit: 2017-03-11 | Discharge: 2017-03-11 | Disposition: A | Discharge status: Home / Self Care | Payer: Medicare Other | Source: Ambulatory Visit | Attending: Neurological Surgery | Admitting: Neurological Surgery

## 2017-03-11 DIAGNOSIS — Z01810 Encounter for preprocedural cardiovascular examination: Secondary | ICD-10-CM | POA: Insufficient documentation

## 2017-03-11 DIAGNOSIS — I444 Left anterior fascicular block: Secondary | ICD-10-CM | POA: Diagnosis not present

## 2017-03-11 DIAGNOSIS — Z01818 Encounter for other preprocedural examination: Secondary | ICD-10-CM | POA: Diagnosis not present

## 2017-03-11 DIAGNOSIS — Z79899 Other long term (current) drug therapy: Secondary | ICD-10-CM | POA: Diagnosis not present

## 2017-03-11 DIAGNOSIS — R918 Other nonspecific abnormal finding of lung field: Secondary | ICD-10-CM | POA: Insufficient documentation

## 2017-03-11 DIAGNOSIS — M419 Scoliosis, unspecified: Secondary | ICD-10-CM | POA: Insufficient documentation

## 2017-03-11 DIAGNOSIS — Z7982 Long term (current) use of aspirin: Secondary | ICD-10-CM | POA: Diagnosis not present

## 2017-03-11 LAB — CBC WITH DIFFERENTIAL/PLATELET
BASOS PCT: 0 %
Basophils Absolute: 0 10*3/uL (ref 0.0–0.1)
Eosinophils Absolute: 0.1 10*3/uL (ref 0.0–0.7)
Eosinophils Relative: 1 %
HEMATOCRIT: 40.5 % (ref 39.0–52.0)
HEMOGLOBIN: 14 g/dL (ref 13.0–17.0)
LYMPHS ABS: 2 10*3/uL (ref 0.7–4.0)
Lymphocytes Relative: 23 %
MCH: 32.3 pg (ref 26.0–34.0)
MCHC: 34.6 g/dL (ref 30.0–36.0)
MCV: 93.5 fL (ref 78.0–100.0)
MONO ABS: 0.6 10*3/uL (ref 0.1–1.0)
MONOS PCT: 7 %
NEUTROS ABS: 6.3 10*3/uL (ref 1.7–7.7)
NEUTROS PCT: 69 %
Platelets: 183 10*3/uL (ref 150–400)
RBC: 4.33 MIL/uL (ref 4.22–5.81)
RDW: 12.3 % (ref 11.5–15.5)
WBC: 9 10*3/uL (ref 4.0–10.5)

## 2017-03-11 LAB — BASIC METABOLIC PANEL
ANION GAP: 8 (ref 5–15)
BUN: 24 mg/dL — ABNORMAL HIGH (ref 6–20)
CALCIUM: 9.2 mg/dL (ref 8.9–10.3)
CHLORIDE: 106 mmol/L (ref 101–111)
CO2: 24 mmol/L (ref 22–32)
Creatinine, Ser: 1.29 mg/dL — ABNORMAL HIGH (ref 0.61–1.24)
GFR calc Af Amer: >60 mL/min (ref >60)
GFR calc non Af Amer: 53 mL/min — ABNORMAL LOW (ref >60)
GLUCOSE: 125 mg/dL — AB (ref 65–99)
Potassium: 4 mmol/L (ref 3.5–5.1)
Sodium: 138 mmol/L (ref 135–145)

## 2017-03-11 LAB — TYPE AND SCREEN
ABO/RH(D): A POS
ANTIBODY SCREEN: NEGATIVE

## 2017-03-11 LAB — SURGICAL PCR SCREEN
MRSA, PCR: NEGATIVE
Staphylococcus aureus: NEGATIVE

## 2017-03-11 LAB — PROTIME-INR
INR: 1.03
Prothrombin Time: 13.4 seconds (ref 11.4–15.2)

## 2017-03-11 LAB — ABO/RH: ABO/RH(D): A POS

## 2017-03-11 NOTE — Progress Notes (Signed)
PCP - Dr. Su Hiltoberts Cardiologist - Dr. Donnie Ahoilley  Chest x-ray - 03/11/2017 EKG - 03/11/2017 Stress Test - 2016?; results requested from Dr. York Spanielilley's office ECHO - patient is not sure, possibly had one but [place] was [place]. Cardiac Cath - patient denies  Sleep Study - patient denies   Patient denies shortness of breath, fever, cough and chest pain at PAT appointment   Patient verbalized understanding of instructions that were given to them at the PAT appointment. Patient was also instructed that they will need to review over the PAT instructions again at home before surgery.

## 2017-03-11 NOTE — Pre-Procedure Instructions (Signed)
Richard HartshornRobert E Williams  03/11/2017      [facility] [id number] - [place] - [address] AT [facility] [address] [facility] [name][address]  [place] [id number] Phone: [phone] Fax: [phone]    Your procedure is scheduled on Thursday, March 20, 2017.  [facility] at 0830 A.M.  Call this number if you have problems the morning of surgery:  [phone]   Remember:  Do not eat food or drink liquids after midnight.  Continue all other medications as directed by your physician except follow these medication instructions before surgery   Take these medicines the morning of surgery with A SIP OF WATER: Atenolol (Tenormin) Azelastine (Astelin) nasal spray Bupropion (Wellbutrin SR) Buspirone (Buspar) Cetirizine (Zyrtec) Esomeprazole (Nexium) Methocarbamol (Robaxin) - if needed  7 days prior to surgery STOP taking any Aspirin (unless otherwise instructed by your surgeon), Aleve, Naproxen, Ibuprofen, Motrin, Advil, Goody's, BC's, all herbal medications, fish oil, and all vitamins     Do not wear jewelry.  Do not wear lotions, powders, or colognes, or deodorant.  Men may shave face and neck.  Do not bring valuables to the hospital.  [facility] is not responsible for any belongings or valuables.  Contacts, eyeglasses, dentures or bridgework may not be worn into surgery.  Leave your suitcase in the car.  After surgery it may be brought to your room.  For patients admitted to the hospital, discharge time will be determined by your treatment team.  Patients discharged the day of surgery will not be allowed to drive home.   Name and phone number of your driver:    Special instructions:   [name]- Preparing For Surgery  Before surgery, you can play an important role. Because skin is not sterile, your skin needs to be as free of germs as possible. You can reduce the number of germs on your skin by washing with CHG (chlorahexidine  gluconate) Soap before surgery.  CHG is an antiseptic cleaner which kills germs and bonds with the skin to continue killing germs even after washing.  Please do not use if you have an allergy to CHG or antibacterial soaps. If your skin becomes reddened/irritated stop using the CHG.  Do not shave (including legs and underarms) for at least 48 hours prior to first CHG shower. It is OK to shave your face.  Please follow these instructions carefully.   1. Shower the NIGHT BEFORE SURGERY and the MORNING OF SURGERY with CHG.   2. If you chose to wash your hair, wash your hair first as usual with your normal shampoo.  3. After you shampoo, rinse your hair and body thoroughly to remove the shampoo.  4. Use CHG as you would any other liquid soap. You can apply CHG directly to the skin and wash gently with a scrungie or a clean washcloth.   5. Apply the CHG Soap to your body ONLY FROM THE NECK DOWN.  Do not use on open wounds or open sores. Avoid contact with your eyes, ears, mouth and genitals (private parts). Wash Face and genitals (private parts)  with your normal soap.  6. Wash thoroughly, paying special attention to the area where your surgery will be performed.  7. Thoroughly rinse your body with warm water from the neck down.  8. DO NOT shower/wash with your normal soap after using and rinsing off the CHG Soap.  9. Pat yourself dry with a CLEAN  TOWEL.  10. Wear CLEAN PAJAMAS to bed the night before surgery, wear comfortable clothes the morning of surgery  11. Place CLEAN SHEETS on your bed the night of your first shower and DO NOT SLEEP WITH PETS.    Day of Surgery: Shower as stated above. Do not apply any deodorants/lotions. Please wear clean clothes to the hospital/surgery center.      Please read over the following fact sheets that you were given. Pain Booklet, Coughing and Deep Breathing, MRSA Information and Surgical Site Infection Prevention

## 2017-03-13 NOTE — Progress Notes (Addendum)
Anesthesia Chart Review: Patient is a 76 year old male scheduled for posterior lateral fusion L1-3 on 03/20/17 by Dr. Marikay Alaravid Jones.   PMH includes:  HTN, PVD, atrial fibrillation (x1 many years ago, no recurrence), mild aortic root enlargement (3.7 cm AoD documented on 11/28/14 echo), GERD, BPH, tonsillectomy, left inguinal hernia repair 11/13/15, L1-3 lumbar laminectomy 02/10/14. Never smoker.   PCP is Dr. Burton Apleyonald Roberts. Cardiologist is Dr. Viann FishSpencer Tilley, last seen 11/16/14 for evaluation of dyspnea (referral from Dr. Su Hiltoberts). ETT and echo done (see below).   BP 128/74   Pulse 92   Temp 36.6 C   Resp 20   Ht 6' (1.829 m)   Wt 202 lb 9.6 oz (91.9 kg)   SpO2 95%   BMI 27.48 kg/m   Medications include: ASA 81 mg (instructed to hold 5-7 days prior to surgery per surgeon), atenolol, Astelin, Wellbutrin SR, BuSpar, Zyrtec, Nexium, Flonase, Robaxin, Singulair, terazosin.  Preoperative labs reviewed.    EKG 03/11/17: NSR, LAD, moderate voltage criteria for LVH, may be normal variant, LAFB. No significant change since last tracing 10/31/15.   Exercise tolerance test 12/07/14:  1. Clinically and EKG negative for ischemia on adequate exercise treadmill test 2. Good exercise capacity for age  Echo 11/28/14 (Dr. Donnie Ahoilley; scanned under Media tab, Correspondence 11/16/15):  1. Mild concentric LVH with normal LV systolic function. EF 55% 2. Doppler evidence grade I diastolic dysfunction 3. Trace MR and PR 4. Mild TR 5. Mild aortic root enlargement (AoD 3.7 cm) 6. 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 There may be a hiatal hernia here but it was not previously demonstrated. Followup PA and lateral chest X-ray is recommended in 3-4 weeks following trial of antibiotic therapy to ensure resolution and exclude underlying malignancy.  Preoperative labs noted. CBC, PT/PTT WNL. Cr 1.29. Glucose 125. T&S  done.   [facility]. Yetta BarreJones' office. Patient denied SOB, fever, cough, and chest pain at PAT. Would recommend that patient have medical evaluation for clinical correlation and chest imaging follow-up recommendations. Plans to proceed as scheduled will depend on additional input.  Velna Ochsllison Amerigo Mcglory, PA-C [facility]/Anesthesiology Phone [phone] 03/13/2017 3:28 PM  Addendum: Update received from [place] NoeVanessa at Dr. Yetta BarreJones' office. Patient was evaluated by Dr. Su Hiltoberts and reportedly cleared for surgery. CXR was also repeated on 03/17/17 and showed a moderate sized hiatal hernia, but no focal pneumonia or other acute cardiopulmonary abnormality. Patient had no chest complaints. Based on currently available information, I would anticipate that he can proceed as planned.  Velna Ochsllison Minetta Krisher, PA-C [facility]/Anesthesiology Phone [phone] 03/18/2017 2:08 PM

## 2017-03-17 ENCOUNTER — Other Ambulatory Visit (HOSPITAL_COMMUNITY): Payer: Self-pay | Admitting: Neurological Surgery

## 2017-03-17 ENCOUNTER — Ambulatory Visit (HOSPITAL_COMMUNITY)
Admission: RE | Admit: 2017-03-17 | Discharge: 2017-03-17 | Disposition: A | Discharge status: Home / Self Care | Payer: Medicare Other | Source: Ambulatory Visit | Attending: Neurological Surgery | Admitting: Neurological Surgery

## 2017-03-17 DIAGNOSIS — J189 Pneumonia, unspecified organism: Secondary | ICD-10-CM

## 2017-03-17 DIAGNOSIS — K449 Diaphragmatic hernia without obstruction or gangrene: Secondary | ICD-10-CM | POA: Insufficient documentation

## 2017-03-20 ENCOUNTER — Inpatient Hospital Stay (HOSPITAL_COMMUNITY): Payer: Medicare Other

## 2017-03-20 ENCOUNTER — Inpatient Hospital Stay (HOSPITAL_COMMUNITY): Payer: Medicare Other | Admitting: Certified Registered Nurse Anesthetist

## 2017-03-20 ENCOUNTER — Encounter (HOSPITAL_COMMUNITY)
Admission: RE | Disposition: A | Discharge status: Home / Self Care | Payer: Self-pay | Source: Ambulatory Visit | Attending: Neurological Surgery

## 2017-03-20 ENCOUNTER — Inpatient Hospital Stay (HOSPITAL_COMMUNITY): Payer: Medicare Other | Admitting: Vascular Surgery

## 2017-03-20 ENCOUNTER — Inpatient Hospital Stay (HOSPITAL_COMMUNITY)
Admission: RE | Admit: 2017-03-20 | Discharge: 2017-03-21 | DRG: 460 | Disposition: A | Discharge status: Home / Self Care | Payer: Medicare Other | Source: Ambulatory Visit | Attending: Neurological Surgery | Admitting: Neurological Surgery

## 2017-03-20 ENCOUNTER — Encounter (HOSPITAL_COMMUNITY): Payer: Self-pay | Admitting: Certified Registered Nurse Anesthetist

## 2017-03-20 DIAGNOSIS — Z881 Allergy status to other antibiotic agents status: Secondary | ICD-10-CM | POA: Diagnosis not present

## 2017-03-20 DIAGNOSIS — Z7951 Long term (current) use of inhaled steroids: Secondary | ICD-10-CM | POA: Diagnosis not present

## 2017-03-20 DIAGNOSIS — M419 Scoliosis, unspecified: Secondary | ICD-10-CM | POA: Diagnosis present

## 2017-03-20 DIAGNOSIS — M4726 Other spondylosis with radiculopathy, lumbar region: Secondary | ICD-10-CM | POA: Diagnosis present

## 2017-03-20 DIAGNOSIS — Z7982 Long term (current) use of aspirin: Secondary | ICD-10-CM

## 2017-03-20 DIAGNOSIS — Z885 Allergy status to narcotic agent status: Secondary | ICD-10-CM

## 2017-03-20 DIAGNOSIS — I10 Essential (primary) hypertension: Secondary | ICD-10-CM | POA: Diagnosis present

## 2017-03-20 DIAGNOSIS — F418 Other specified anxiety disorders: Secondary | ICD-10-CM | POA: Diagnosis present

## 2017-03-20 DIAGNOSIS — K219 Gastro-esophageal reflux disease without esophagitis: Secondary | ICD-10-CM | POA: Diagnosis present

## 2017-03-20 DIAGNOSIS — N400 Enlarged prostate without lower urinary tract symptoms: Secondary | ICD-10-CM | POA: Diagnosis present

## 2017-03-20 DIAGNOSIS — M545 Low back pain: Secondary | ICD-10-CM | POA: Diagnosis present

## 2017-03-20 DIAGNOSIS — M7138 Other bursal cyst, other site: Secondary | ICD-10-CM | POA: Diagnosis present

## 2017-03-20 DIAGNOSIS — J449 Chronic obstructive pulmonary disease, unspecified: Secondary | ICD-10-CM | POA: Diagnosis present

## 2017-03-20 DIAGNOSIS — Z419 Encounter for procedure for purposes other than remedying health state, unspecified: Secondary | ICD-10-CM

## 2017-03-20 DIAGNOSIS — I739 Peripheral vascular disease, unspecified: Secondary | ICD-10-CM | POA: Diagnosis present

## 2017-03-20 DIAGNOSIS — Z981 Arthrodesis status: Secondary | ICD-10-CM

## 2017-03-20 HISTORY — PX: LAMINECTOMY WITH POSTERIOR LATERAL ARTHRODESIS LEVEL 2: SHX6336

## 2017-03-20 SURGERY — LAMINECTOMY WITH POSTERIOR LATERAL ARTHRODESIS LEVEL 2
Anesthesia: General | Site: Back

## 2017-03-20 MED ORDER — BUPIVACAINE HCL (PF) 0.25 % IJ SOLN
INTRAMUSCULAR | Status: AC
  Filled 2017-03-20: qty 30

## 2017-03-20 MED ORDER — FENTANYL CITRATE (PF) 250 MCG/5ML IJ SOLN
INTRAMUSCULAR | Status: AC
  Filled 2017-03-20: qty 5

## 2017-03-20 MED ORDER — SODIUM CHLORIDE 0.9% FLUSH
3.0000 mL | Freq: Two times a day (BID) | INTRAVENOUS | Status: DC
  Administered 2017-03-21: 3 mL via INTRAVENOUS

## 2017-03-20 MED ORDER — PHENOL 1.4 % MT LIQD: 1.0000 | OROMUCOSAL | Status: DC | PRN

## 2017-03-20 MED ORDER — ROCURONIUM BROMIDE 100 MG/10ML IV SOLN
INTRAVENOUS | Status: DC | PRN
Start: 2017-03-20 — End: 2017-03-20
  Administered 2017-03-20: 10 mg via INTRAVENOUS
  Administered 2017-03-20: 50 mg via INTRAVENOUS

## 2017-03-20 MED ORDER — VANCOMYCIN HCL 10 G IV SOLR
1500.0000 mg | INTRAVENOUS | Status: DC
  Administered 2017-03-20: 1500 mg via INTRAVENOUS
  Filled 2017-03-20 (×2): qty 1500

## 2017-03-20 MED ORDER — VANCOMYCIN HCL 1000 MG IV SOLR
INTRAVENOUS | Status: DC | PRN
  Administered 2017-03-20: 1000 mg via TOPICAL

## 2017-03-20 MED ORDER — ROCURONIUM BROMIDE 10 MG/ML (PF) SYRINGE
PREFILLED_SYRINGE | INTRAVENOUS | Status: AC
  Filled 2017-03-20: qty 5

## 2017-03-20 MED ORDER — SUGAMMADEX SODIUM 200 MG/2ML IV SOLN
INTRAVENOUS | Status: DC | PRN
  Administered 2017-03-20: 200 mg via INTRAVENOUS

## 2017-03-20 MED ORDER — MENTHOL 3 MG MT LOZG: 1.0000 | LOZENGE | OROMUCOSAL | Status: DC | PRN

## 2017-03-20 MED ORDER — FENTANYL CITRATE (PF) 100 MCG/2ML IJ SOLN
INTRAMUSCULAR | Status: DC | PRN
  Administered 2017-03-20: 50 ug via INTRAVENOUS
  Administered 2017-03-20: 100 ug via INTRAVENOUS

## 2017-03-20 MED ORDER — PHENYLEPHRINE HCL 10 MG/ML IJ SOLN
INTRAVENOUS | Status: DC | PRN
  Administered 2017-03-20: 20 ug/min via INTRAVENOUS

## 2017-03-20 MED ORDER — DIPHENHYDRAMINE HCL 12.5 MG/5ML PO ELIX: 12.5000 mg | ORAL_SOLUTION | Freq: Four times a day (QID) | ORAL | Status: DC | PRN

## 2017-03-20 MED ORDER — PHENYLEPHRINE 40 MCG/ML (10ML) SYRINGE FOR IV PUSH (FOR BLOOD PRESSURE SUPPORT)
PREFILLED_SYRINGE | INTRAVENOUS | Status: AC
  Filled 2017-03-20: qty 10

## 2017-03-20 MED ORDER — ACETAMINOPHEN 650 MG RE SUPP: 650.0000 mg | RECTAL | Status: DC | PRN

## 2017-03-20 MED ORDER — BUSPIRONE HCL 10 MG PO TABS
10.0000 mg | ORAL_TABLET | Freq: Two times a day (BID) | ORAL | Status: DC
  Administered 2017-03-20 – 2017-03-21 (×2): 10 mg via ORAL
  Filled 2017-03-20 (×3): qty 1

## 2017-03-20 MED ORDER — DIPHENHYDRAMINE HCL 50 MG/ML IJ SOLN
INTRAMUSCULAR | Status: DC | PRN
  Administered 2017-03-20: 12.5 mg via INTRAVENOUS

## 2017-03-20 MED ORDER — ONDANSETRON HCL 4 MG/2ML IJ SOLN
INTRAMUSCULAR | Status: DC | PRN
  Administered 2017-03-20: 4 mg via INTRAVENOUS

## 2017-03-20 MED ORDER — PROPOFOL 10 MG/ML IV BOLUS
INTRAVENOUS | Status: AC
  Filled 2017-03-20: qty 20

## 2017-03-20 MED ORDER — MONTELUKAST SODIUM 10 MG PO TABS
10.0000 mg | ORAL_TABLET | Freq: Every day | ORAL | Status: DC
  Administered 2017-03-20: 10 mg via ORAL
  Filled 2017-03-20 (×2): qty 1

## 2017-03-20 MED ORDER — ATENOLOL 25 MG PO TABS
25.0000 mg | ORAL_TABLET | Freq: Every day | ORAL | Status: DC
  Administered 2017-03-21: 25 mg via ORAL
  Filled 2017-03-20: qty 1

## 2017-03-20 MED ORDER — METHOCARBAMOL 500 MG PO TABS
500.0000 mg | ORAL_TABLET | Freq: Four times a day (QID) | ORAL | Status: DC | PRN
  Administered 2017-03-20 – 2017-03-21 (×3): 500 mg via ORAL
  Filled 2017-03-20 (×4): qty 1

## 2017-03-20 MED ORDER — BUPIVACAINE LIPOSOME 1.3 % IJ SUSP
20.0000 mL | INTRAMUSCULAR | Status: AC
  Administered 2017-03-20: 20 mL
  Filled 2017-03-20: qty 20

## 2017-03-20 MED ORDER — FENTANYL 40 MCG/ML IV SOLN
INTRAVENOUS | Status: DC
  Administered 2017-03-20: 130 ug via INTRAVENOUS
  Administered 2017-03-20: 1000 ug via INTRAVENOUS
  Administered 2017-03-20: 30 ug via INTRAVENOUS
  Administered 2017-03-21: 20 ug via INTRAVENOUS
  Administered 2017-03-21: 0 ug via INTRAVENOUS
  Filled 2017-03-20: qty 25

## 2017-03-20 MED ORDER — ACETAMINOPHEN 325 MG PO TABS: 650.0000 mg | ORAL_TABLET | ORAL | Status: DC | PRN

## 2017-03-20 MED ORDER — ASPIRIN EC 81 MG PO TBEC
81.0000 mg | DELAYED_RELEASE_TABLET | Freq: Every day | ORAL | Status: DC
Start: 2017-03-20 — End: 2017-03-21
  Administered 2017-03-21: 81 mg via ORAL
  Filled 2017-03-20: qty 1

## 2017-03-20 MED ORDER — LIDOCAINE HCL (CARDIAC) 20 MG/ML IV SOLN
INTRAVENOUS | Status: DC | PRN
  Administered 2017-03-20: 40 mg via INTRAVENOUS

## 2017-03-20 MED ORDER — THROMBIN (RECOMBINANT) 5000 UNITS EX SOLR
CUTANEOUS | Status: AC
  Filled 2017-03-20: qty 5000

## 2017-03-20 MED ORDER — PHENYLEPHRINE 40 MCG/ML (10ML) SYRINGE FOR IV PUSH (FOR BLOOD PRESSURE SUPPORT)
PREFILLED_SYRINGE | INTRAVENOUS | Status: DC | PRN
  Administered 2017-03-20 (×2): 80 ug via INTRAVENOUS
  Administered 2017-03-20 (×4): 40 ug via INTRAVENOUS

## 2017-03-20 MED ORDER — ONDANSETRON HCL 4 MG/2ML IJ SOLN: 4.0000 mg | Freq: Four times a day (QID) | INTRAMUSCULAR | Status: DC | PRN

## 2017-03-20 MED ORDER — 0.9 % SODIUM CHLORIDE (POUR BTL) OPTIME
TOPICAL | Status: DC | PRN
Start: 2017-03-20 — End: 2017-03-20
  Administered 2017-03-20: 1000 mL

## 2017-03-20 MED ORDER — PROPOFOL 10 MG/ML IV BOLUS
INTRAVENOUS | Status: DC | PRN
  Administered 2017-03-20: 120 mg via INTRAVENOUS

## 2017-03-20 MED ORDER — VANCOMYCIN HCL IN DEXTROSE 1-5 GM/200ML-% IV SOLN
INTRAVENOUS | Status: AC
  Filled 2017-03-20: qty 200

## 2017-03-20 MED ORDER — VANCOMYCIN HCL IN DEXTROSE 1-5 GM/200ML-% IV SOLN
1000.0000 mg | INTRAVENOUS | Status: AC
  Administered 2017-03-20: 1000 mg via INTRAVENOUS

## 2017-03-20 MED ORDER — SODIUM CHLORIDE 0.9 % IR SOLN
Status: DC | PRN
  Administered 2017-03-20: 11:00:00

## 2017-03-20 MED ORDER — DIPHENHYDRAMINE HCL 50 MG/ML IJ SOLN
INTRAMUSCULAR | Status: AC
  Filled 2017-03-20: qty 1

## 2017-03-20 MED ORDER — TERAZOSIN HCL 2 MG PO CAPS
4.0000 mg | ORAL_CAPSULE | Freq: Every day | ORAL | Status: DC
  Filled 2017-03-20 (×2): qty 2

## 2017-03-20 MED ORDER — TERAZOSIN HCL 2 MG PO CAPS
4.0000 mg | ORAL_CAPSULE | Freq: Every day | ORAL | Status: DC
  Administered 2017-03-20: 4 mg via ORAL
  Filled 2017-03-20 (×2): qty 2

## 2017-03-20 MED ORDER — KETOROLAC TROMETHAMINE 30 MG/ML IJ SOLN
INTRAMUSCULAR | Status: AC
  Filled 2017-03-20: qty 2

## 2017-03-20 MED ORDER — DIPHENHYDRAMINE HCL 50 MG/ML IJ SOLN: 12.5000 mg | Freq: Four times a day (QID) | INTRAMUSCULAR | Status: DC | PRN

## 2017-03-20 MED ORDER — LIDOCAINE 2% (20 MG/ML) 5 ML SYRINGE
INTRAMUSCULAR | Status: AC
  Filled 2017-03-20: qty 5

## 2017-03-20 MED ORDER — CHLORHEXIDINE GLUCONATE CLOTH 2 % EX PADS: 6.0000 | MEDICATED_PAD | Freq: Once | CUTANEOUS | Status: DC

## 2017-03-20 MED ORDER — BUPIVACAINE HCL (PF) 0.25 % IJ SOLN
INTRAMUSCULAR | Status: DC | PRN
  Administered 2017-03-20: 5 mL

## 2017-03-20 MED ORDER — MIDAZOLAM HCL 5 MG/5ML IJ SOLN
INTRAMUSCULAR | Status: DC | PRN
  Administered 2017-03-20: 1 mg via INTRAVENOUS

## 2017-03-20 MED ORDER — ONDANSETRON HCL 4 MG/2ML IJ SOLN
INTRAMUSCULAR | Status: AC
  Filled 2017-03-20: qty 2

## 2017-03-20 MED ORDER — THROMBIN (RECOMBINANT) 5000 UNITS EX SOLR
OROMUCOSAL | Status: DC | PRN
  Administered 2017-03-20: 11:00:00 via TOPICAL

## 2017-03-20 MED ORDER — ARTIFICIAL TEARS OPHTHALMIC OINT
TOPICAL_OINTMENT | OPHTHALMIC | Status: DC | PRN
Start: 2017-03-20 — End: 2017-03-20
  Administered 2017-03-20: 1 via OPHTHALMIC

## 2017-03-20 MED ORDER — VANCOMYCIN HCL 1000 MG IV SOLR
INTRAVENOUS | Status: AC
  Filled 2017-03-20: qty 1000

## 2017-03-20 MED ORDER — ARTIFICIAL TEARS OPHTHALMIC OINT
TOPICAL_OINTMENT | OPHTHALMIC | Status: AC
  Filled 2017-03-20: qty 3.5

## 2017-03-20 MED ORDER — SODIUM CHLORIDE 0.9% FLUSH: 3.0000 mL | INTRAVENOUS | Status: DC | PRN

## 2017-03-20 MED ORDER — ACETAMINOPHEN 10 MG/ML IV SOLN
INTRAVENOUS | Status: AC
  Filled 2017-03-20: qty 100

## 2017-03-20 MED ORDER — DEXAMETHASONE SODIUM PHOSPHATE 10 MG/ML IJ SOLN
10.0000 mg | INTRAMUSCULAR | Status: AC
  Administered 2017-03-20: 10 mg via INTRAVENOUS

## 2017-03-20 MED ORDER — NALOXONE HCL 0.4 MG/ML IJ SOLN: 0.4000 mg | INTRAMUSCULAR | Status: DC | PRN

## 2017-03-20 MED ORDER — VITAMIN C 500 MG PO TABS
500.0000 mg | ORAL_TABLET | Freq: Every day | ORAL | Status: DC
  Administered 2017-03-21: 500 mg via ORAL
  Filled 2017-03-20 (×2): qty 1

## 2017-03-20 MED ORDER — SENNA 8.6 MG PO TABS
1.0000 | ORAL_TABLET | Freq: Two times a day (BID) | ORAL | Status: DC
  Administered 2017-03-20 – 2017-03-21 (×2): 8.6 mg via ORAL
  Filled 2017-03-20 (×2): qty 1

## 2017-03-20 MED ORDER — BUPROPION HCL ER (SR) 100 MG PO TB12
200.0000 mg | ORAL_TABLET | Freq: Two times a day (BID) | ORAL | Status: DC
  Administered 2017-03-20 – 2017-03-21 (×2): 200 mg via ORAL
  Filled 2017-03-20 (×3): qty 2

## 2017-03-20 MED ORDER — MIDAZOLAM HCL 2 MG/2ML IJ SOLN
INTRAMUSCULAR | Status: AC
  Filled 2017-03-20: qty 2

## 2017-03-20 MED ORDER — SODIUM CHLORIDE 0.9% FLUSH: 9.0000 mL | INTRAVENOUS | Status: DC | PRN

## 2017-03-20 MED ORDER — DEXAMETHASONE SODIUM PHOSPHATE 10 MG/ML IJ SOLN
INTRAMUSCULAR | Status: AC
  Filled 2017-03-20: qty 1

## 2017-03-20 MED ORDER — ACETAMINOPHEN 10 MG/ML IV SOLN
INTRAVENOUS | Status: DC | PRN
  Administered 2017-03-20: 1000 mg via INTRAVENOUS

## 2017-03-20 MED ORDER — ONDANSETRON HCL 4 MG PO TABS: 4.0000 mg | ORAL_TABLET | Freq: Four times a day (QID) | ORAL | Status: DC | PRN

## 2017-03-20 MED ORDER — SUGAMMADEX SODIUM 200 MG/2ML IV SOLN
INTRAVENOUS | Status: AC
  Filled 2017-03-20: qty 2

## 2017-03-20 MED ORDER — LACTATED RINGERS IV SOLN
INTRAVENOUS | Status: DC | PRN
  Administered 2017-03-20 (×2): via INTRAVENOUS

## 2017-03-20 MED ORDER — POTASSIUM CHLORIDE IN NACL 20-0.9 MEQ/L-% IV SOLN
INTRAVENOUS | Status: DC
  Filled 2017-03-20: qty 1000

## 2017-03-20 MED ORDER — METHOCARBAMOL 500 MG PO TABS
ORAL_TABLET | ORAL | Status: AC
  Filled 2017-03-20: qty 1

## 2017-03-20 SURGICAL SUPPLY — 62 items
BAG DECANTER FOR FLEXI CONT (MISCELLANEOUS) ×3 IMPLANT
BASKET BONE COLLECTION (BASKET) ×3 IMPLANT
BENZOIN TINCTURE PRP APPL 2/3 (GAUZE/BANDAGES/DRESSINGS) ×3 IMPLANT
BLADE CLIPPER SURG (BLADE) IMPLANT
BONE CANC CHIPS 40CC CAN1/2 (Bone Implant) ×3 IMPLANT
BONE VIVIGEN FORMABLE 10CC (Bone Implant) ×3 IMPLANT
BUR MATCHSTICK NEURO 3.0 LAGG (BURR) ×3 IMPLANT
CANISTER SUCT 3000ML PPV (MISCELLANEOUS) ×3 IMPLANT
CARTRIDGE OIL MAESTRO DRILL (MISCELLANEOUS) ×1 IMPLANT
CHIPS CANC BONE 40CC CAN1/2 (Bone Implant) ×1 IMPLANT
CLOSURE WOUND 1/2 X4 (GAUZE/BANDAGES/DRESSINGS) ×1
CONT SPEC 4OZ CLIKSEAL STRL BL (MISCELLANEOUS) ×3 IMPLANT
COVER BACK TABLE 60X90IN (DRAPES) ×3 IMPLANT
DECANTER SPIKE VIAL GLASS SM (MISCELLANEOUS) ×3 IMPLANT
DERMABOND ADVANCED (GAUZE/BANDAGES/DRESSINGS) ×2
DERMABOND ADVANCED .7 DNX12 (GAUZE/BANDAGES/DRESSINGS) ×1 IMPLANT
DIFFUSER DRILL AIR PNEUMATIC (MISCELLANEOUS) ×3 IMPLANT
DRAPE C-ARM 42X72 X-RAY (DRAPES) ×6 IMPLANT
DRAPE C-ARMOR (DRAPES) ×3 IMPLANT
DRAPE LAPAROTOMY 100X72X124 (DRAPES) ×3 IMPLANT
DRAPE POUCH INSTRU U-SHP 10X18 (DRAPES) ×3 IMPLANT
DRAPE SURG 17X23 STRL (DRAPES) ×3 IMPLANT
DRSG OPSITE POSTOP 4X8 (GAUZE/BANDAGES/DRESSINGS) ×3 IMPLANT
DURAPREP 26ML APPLICATOR (WOUND CARE) ×3 IMPLANT
ELECT REM PT RETURN 9FT ADLT (ELECTROSURGICAL) ×3
ELECTRODE REM PT RTRN 9FT ADLT (ELECTROSURGICAL) ×1 IMPLANT
EVACUATOR 1/8 PVC DRAIN (DRAIN) ×3 IMPLANT
GAUZE SPONGE 4X4 16PLY XRAY LF (GAUZE/BANDAGES/DRESSINGS) IMPLANT
GLOVE BIO SURGEON STRL SZ7 (GLOVE) IMPLANT
GLOVE BIO SURGEON STRL SZ8 (GLOVE) ×6 IMPLANT
GLOVE BIOGEL PI IND STRL 7.0 (GLOVE) IMPLANT
GLOVE BIOGEL PI INDICATOR 7.0 (GLOVE)
GOWN STRL REUS W/ TWL LRG LVL3 (GOWN DISPOSABLE) IMPLANT
GOWN STRL REUS W/ TWL XL LVL3 (GOWN DISPOSABLE) ×2 IMPLANT
GOWN STRL REUS W/TWL 2XL LVL3 (GOWN DISPOSABLE) IMPLANT
GOWN STRL REUS W/TWL LRG LVL3 (GOWN DISPOSABLE)
GOWN STRL REUS W/TWL XL LVL3 (GOWN DISPOSABLE) ×4
HEMOSTAT POWDER KIT SURGIFOAM (HEMOSTASIS) IMPLANT
KIT BASIN OR (CUSTOM PROCEDURE TRAY) ×3 IMPLANT
KIT ROOM TURNOVER OR (KITS) ×3 IMPLANT
NEEDLE HYPO 21X1.5 SAFETY (NEEDLE) ×3 IMPLANT
NEEDLE HYPO 25X1 1.5 SAFETY (NEEDLE) ×3 IMPLANT
NS IRRIG 1000ML POUR BTL (IV SOLUTION) ×3 IMPLANT
OIL CARTRIDGE MAESTRO DRILL (MISCELLANEOUS) ×3
PACK LAMINECTOMY NEURO (CUSTOM PROCEDURE TRAY) ×3 IMPLANT
PAD ARMBOARD 7.5X6 YLW CONV (MISCELLANEOUS) ×9 IMPLANT
ROD PC 5.5X80 TI ARSENAL (Rod) ×6 IMPLANT
SCREW CBX 5.5X45 (Screw) ×3 IMPLANT
SCREW CORT CBX 5.5X40 (Screw) ×15 IMPLANT
SCREW SET SPINAL ARSENAL 47127 (Screw) ×18 IMPLANT
SPONGE LAP 4X18 X RAY DECT (DISPOSABLE) IMPLANT
SPONGE SURGIFOAM ABS GEL 100 (HEMOSTASIS) ×3 IMPLANT
STRIP CLOSURE SKIN 1/2X4 (GAUZE/BANDAGES/DRESSINGS) ×2 IMPLANT
SUT VIC AB 0 CT1 18XCR BRD8 (SUTURE) ×1 IMPLANT
SUT VIC AB 0 CT1 8-18 (SUTURE) ×2
SUT VIC AB 2-0 CP2 18 (SUTURE) ×3 IMPLANT
SUT VIC AB 3-0 SH 8-18 (SUTURE) ×6 IMPLANT
SYRINGE 20CC LL (MISCELLANEOUS) ×3 IMPLANT
TOWEL GREEN STERILE (TOWEL DISPOSABLE) ×3 IMPLANT
TOWEL GREEN STERILE FF (TOWEL DISPOSABLE) ×3 IMPLANT
TRAY FOLEY W/METER SILVER 16FR (SET/KITS/TRAYS/PACK) IMPLANT
WATER STERILE IRR 1000ML POUR (IV SOLUTION) ×3 IMPLANT

## 2017-03-20 NOTE — Progress Notes (Addendum)
Pharmacy Antibiotic Note  Richard HartshornRobert E Arseneault is a 76 y.o male admitted on 03/20/17 for  L1-3 fusion.  Pharmacy has been consulted for Vancomycin post op for surgical prophylaxis.  Drain placed, thus vancomycin to continue until discontinued by MD. Preop Vancomycin 1g IV x1 given 03/20/17 @ 10:10 AM Afebrile, preop WBC wnl,  SCr  1.29 on 03/11/17, estimated CrCl ~ 55 ml/min preop MRSA PCR and Staph aureus PCR on 03/11/17 : negative   Plan: Vancomycin 1500mg  IV q24h  Monitor clinical progress, renal function, culture results and vanc trough at steady state as needed.  Check Scr in AM  Height: 6' (182.9 cm) Weight: 202 lb 9.6 oz (91.9 kg) IBW/kg (Calculated) : 77.6  Temp (24hrs), Avg:97.8 F (36.6 C), Min:97.4 F (36.3 C), Max:98.5 F (36.9 C)  No results for input(s): WBC, CREATININE, LATICACIDVEN, VANCOTROUGH, VANCOPEAK, VANCORANDOM, GENTTROUGH, GENTPEAK, GENTRANDOM, TOBRATROUGH, TOBRAPEAK, TOBRARND, AMIKACINPEAK, AMIKACINTROU, AMIKACIN in the last 168 hours.  Estimated Creatinine Clearance: 55.1 mL/min (A) (by C-G formula based on SCr of 1.29 mg/dL (H)).    Allergies  Allergen Reactions  . Codeine Hives  . Other Itching and Other (See Comments)    OPIODS--All over itch  Including Laudanum  . Amoxicillin Other (See Comments)    Cold sores  Has patient had a PCN reaction causing immediate rash, facial/tongue/throat swelling, SOB or lightheadedness with hypotension: No Has patient had a PCN reaction causing severe rash involving mucus membranes or skin necrosis: Yes Has patient had a PCN reaction that required hospitalization: No Has patient had a PCN reaction occurring within the last 10 years: No If all of the above answers are "NO", then may proceed with Cephalosporin use.     Antimicrobials this admission: Vancomycin 11/15>>  Dose adjustments this admission: n/a  Microbiology results: 11/6: preop MRSA PCR : negative 11/6:  Staph aureus PCR : negative   Thank you for  allowing pharmacy to be a part of this patient's care. Noah Delaineuth Dionta Larke, RPh Clinical Pharmacist Pager: [phone] 8a-330p [phone] phone [phone] or [phone] Main pharmacy [phone] 03/20/2017 2:37 PM

## 2017-03-20 NOTE — Transfer of Care (Signed)
Immediate Anesthesia Transfer of Care Note  Patient: Richard HartshornRobert E Gowen  Procedure(s) Performed: Posterior Lateral Fusion - L1 - L3, segmental fixation L1-3 (N/A Back)  Patient Location: PACU  Anesthesia Type:General  Level of Consciousness: awake, alert  and oriented  Airway & Oxygen Therapy: Patient Spontanous Breathing and Patient connected to nasal cannula oxygen  Post-op Assessment: Report given to RN and Post -op Vital signs reviewed and stable  Post vital signs: Reviewed and stable  Last Vitals:  Vitals:   03/20/17 0850 03/20/17 1247  BP: 135/80 123/76  Pulse: 77 75  Resp: 18 (!) 7  Temp: 36.4 C 36.4 C  SpO2: 95% 97%    Last Pain:  Vitals:   03/20/17 1247  TempSrc:   PainSc: Asleep         Complications: No apparent anesthesia complications

## 2017-03-20 NOTE — Evaluation (Signed)
Physical Therapy Evaluation Patient Details Name: Richard HartshornRobert E Williams MRN: [id number] DOB: 1940/11/21 Today's Date: 03/20/2017   History of Present Illness  Pt is a 76 y/o male s/p L1-3 laminectomy and posterior fixation. PMH includes anxiety, enlarged aorta, HTN, PVD and back surgery.   Clinical Impression  Patient is s/p above surgery resulting in the deficits listed below (see PT Problem List). PTA, pt was using cane occasionally for mobility. Upon eval, pt with post op pain, weakness, and decreased balance. Required min to min guard assist with mobility and use of IV pole. Educated about use of cane at home to increase stability. Reports wife will be able to assist as needed upon d/c home and will need DME below. Patient will benefit from skilled PT to increase their independence and safety with mobility (while adhering to their precautions) to allow discharge to the venue listed below. Will continue to follow acutely.      Follow Up Recommendations No PT follow up;Supervision for mobility/OOB    Equipment Recommendations  3in1 (PT)    Recommendations for Other Services       Precautions / Restrictions Precautions Precautions: Back Precaution Booklet Issued: Yes (comment) Precaution Comments: Reviewed back precautions with pt and answered all questions pts and family had about precautions  Required Braces or Orthoses: Spinal Brace Spinal Brace: Lumbar corset;Applied in sitting position Restrictions Weight Bearing Restrictions: No      Mobility  Bed Mobility               General bed mobility comments: Sitting EOB upon entry   Transfers Overall transfer level: Needs assistance Equipment used: 1 person hand held assist Transfers: Sit to/from Stand Sit to Stand: Min assist         General transfer comment: Min A for lift assist and steadying. Increased time and effort required to stand. Verbal cues to power through BLEs.  Ambulation/Gait Ambulation/Gait assistance:  Min assist;Min guard Ambulation Distance (Feet): 350 Feet Assistive device: (IV pole ) Gait Pattern/deviations: Step-through pattern;Decreased stride length Gait velocity: Decreased Gait velocity interpretation: Below normal speed for age/gender General Gait Details: Slow, very guarded gait. Required min guard to min A for steadying and use of IV pole. Very short steps throughout gait. Educated about generalized walking program to perform at home. Educated about use of cane for increased safety with mobility at home.   Stairs            Wheelchair Mobility    Modified Rankin (Stroke Patients Only)       Balance Overall balance assessment: Needs assistance Sitting-balance support: No upper extremity supported;Feet supported Sitting balance-Leahy Scale: Good     Standing balance support: During functional activity;Single extremity supported Standing balance-Leahy Scale: Poor Standing balance comment: Reliant on single extremity and external support.                              Pertinent Vitals/Pain Pain Assessment: Faces Faces Pain Scale: Hurts little more Pain Location: back  Pain Descriptors / Indicators: Aching;Operative site guarding Pain Intervention(s): Limited activity within patient's tolerance;Monitored during session;Repositioned    Home Living Family/patient expects to be discharged to:: Private residence Living Arrangements: Spouse/significant other Available Help at Discharge: Family;Available 24 hours/day Type of Home: House Home Access: Stairs to enter Entrance Stairs-Rails: Right Entrance Stairs-Number of Steps: 4 Home Layout: One level Home Equipment: Cane - single point;Shower seat - built in      Prior Function  Level of Independence: Independent with assistive device(s)         Comments: Reports he sometimes had to use cane with ambulation      Hand Dominance        Extremity/Trunk Assessment   Upper Extremity  Assessment Upper Extremity Assessment: Defer to OT evaluation    Lower Extremity Assessment Lower Extremity Assessment: Generalized weakness    Cervical / Trunk Assessment Cervical / Trunk Assessment: Other exceptions Cervical / Trunk Exceptions: s/p fusion   Communication   Communication: No difficulties  Cognition Arousal/Alertness: Awake/alert Behavior During Therapy: WFL for tasks assessed/performed Overall Cognitive Status: Within Functional Limits for tasks assessed                                        General Comments General comments (skin integrity, edema, etc.): Pt's wife present during session.     Exercises     Assessment/Plan    PT Assessment Patient needs continued PT services  PT Problem List Decreased strength;Decreased balance;Decreased mobility;Decreased knowledge of precautions;Pain       PT Treatment Interventions DME instruction;Gait training;Therapeutic exercise;Therapeutic activities;Functional mobility training;Stair training;Balance training;Neuromuscular re-education;Patient/family education    PT Goals (Current goals can be found in the Care Plan section)  Acute Rehab PT Goals Patient Stated Goal: to go home  PT Goal Formulation: With patient Time For Goal Achievement: 03/27/17 Potential to Achieve Goals: Good    Frequency Min 5X/week   Barriers to discharge        Co-evaluation               AM-PAC PT "6 Clicks" Daily Activity  Outcome Measure Difficulty turning over in bed (including adjusting bedclothes, sheets and blankets)?: A Little Difficulty moving from lying on back to sitting on the side of the bed? : Unable Difficulty sitting down on and standing up from a chair with arms (e.g., wheelchair, bedside commode, etc,.)?: Unable Help needed moving to and from a bed to chair (including a wheelchair)?: A Little Help needed walking in hospital room?: A Little Help needed climbing 3-5 steps with a railing? : A  Lot 6 Click Score: 13    End of Session Equipment Utilized During Treatment: Gait belt;Back brace Activity Tolerance: Patient tolerated treatment well Patient left: in bed;with call bell/phone within reach;with family/visitor present(sitting EOB ) Nurse Communication: Mobility status PT Visit Diagnosis: Unsteadiness on feet (R26.81);Other abnormalities of gait and mobility (R26.89);Pain Pain - part of body: (back )    Time: [id number] PT Time Calculation (min) (ACUTE ONLY): 34 min   Charges:   PT Evaluation $PT Eval Low Complexity: 1 Low PT Treatments $Gait Training: 8-22 mins   PT G Codes:        Gladys DammeBrittany Emon Miggins, PT, DPT  Acute Rehabilitation Services  Pager: [phone]   Lehman PromBrittany S Nylee Barbuto 03/20/2017, 5:55 PM

## 2017-03-20 NOTE — Anesthesia Procedure Notes (Signed)
Procedure Name: Intubation Date/Time: 03/20/2017 10:01 AM Performed by: Candis Shine, CRNA Pre-anesthesia Checklist: Patient identified, Emergency Drugs available, Suction available and Patient being monitored Patient Re-evaluated:Patient Re-evaluated prior to induction Oxygen Delivery Method: Circle System Utilized Preoxygenation: Pre-oxygenation with 100% oxygen Induction Type: IV induction Ventilation: Mask ventilation without difficulty Laryngoscope Size: Mac and 4 Grade View: Grade I Tube type: Oral Tube size: 7.5 mm Number of attempts: 1 Airway Equipment and Method: Stylet Placement Confirmation: ETT inserted through vocal cords under direct vision,  positive ETCO2 and breath sounds checked- equal and bilateral Secured at: 23 cm Tube secured with: Tape Dental Injury: Teeth and Oropharynx as per pre-operative assessment

## 2017-03-20 NOTE — Op Note (Signed)
03/20/2017  12:45 PM  PATIENT:  Richard Williams  76 y.o. 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 Posterior fixation L1-L3 using Alphatec cortical pedicle screws.  2. Intertransverse arthrodesis L1-L3 using locally harvested morcellized autograft and allograft soaked with bone marrow aspirate.  SURGEON:  Marikay Alaravid Samaya Boardley, MD  ASSISTANTS: None  ANESTHESIA:  General  EBL: 100 ml  Total I/O In: 1000 [I.V.:1000] Out: 295 [Urine:195; Blood:100]  BLOOD ADMINISTERED:none  DRAINS: Medium Hemovac   INDICATION FOR PROCEDURE: This patient presented with severe back pain. Imaging revealed severe lumbar spondylosis. The patient tried a reasonable attempt at conservative medical measures without relief. I recommended decompression and instrumented fusion to address the stenosis as well as the segmental  instability.  Patient understood the risks, benefits, and alternatives and potential outcomes and wished to proceed.  PROCEDURE DETAILS:  The patient was brought to the operating room. After induction of generalized endotracheal anesthesia the patient was rolled into the prone position on chest rolls and all pressure points were padded. The patient's lumbar region was cleaned and then prepped with DuraPrep and draped in the usual sterile fashion. Anesthesia was injected and then a dorsal midline incision was made and carried down to the lumbosacral fascia. The fascia was opened and the paraspinous musculature was taken down in a subperiosteal fashion to expose L1-L3. A self-retaining retractor was placed. Intraoperative fluoroscopy confirmed my level, and I started with placement of the L1 and L3 cortical pedicle screws. The pedicle screw entry zones were identified utilizing surface landmarks and  AP and lateral  fluoroscopy. He had significant scoliosis and we rotated the bed to square up our imaging at each level. I scored the cortex with the high-speed drill and then used the hand drill to drill an upward and outward direction into the pedicle. I then tapped line to line with a 5.5 tap. I then placed a 5.5 x 40 mm cortical pedicle screw into the pedicles of L1 and L3 bilaterally bilaterally. We then checked the placement of our screws with AP and lateral fluoroscopy. The facets were large and overgrown and therefore I was able to remove part of the facet to use autograft. We used a high-speed drill to flatten out the facets. The drill shavings were saved in a mucus trap for later arthrodesis. We saved bone marrow aspirate from the screw holes to place on allograft. We then decorticated the transverse processes and laid a mixture of morcellized autograft and allograft out over these to perform intertransverse arthrodesis at L1 and L3. We then placed lordotic rods into the multiaxial screw heads of the pedicle screws and locked these in position with the locking caps and anti-torque device. We then checked our construct with AP and lateral fluoroscopy. Irrigated with copious amounts of bacitracin-containing saline solution, placed a medium Hemovac drain through a separate stab incision, sprinkled powdered vancomycin into the wound, and closed the muscle and the fascia with 0 Vicryl. Closed the subcutaneous tissues with 2-0 Vicryl and subcuticular tissues with 3-0 Vicryl. The skin was closed with Dermabond, benzoin and Steri-Strips. Dressing was then applied, the patient was awakened from general anesthesia and transported to the recovery room in stable condition. At the end of the procedure all sponge, needle and instrument counts were correct.   PLAN OF CARE: admit to inpatient  PATIENT  DISPOSITION:  PACU - hemodynamically stable.   Delay start of Pharmacological VTE agent (>24hrs) due to surgical blood loss or risk  of bleeding:  yes

## 2017-03-20 NOTE — Anesthesia Postprocedure Evaluation (Signed)
Anesthesia Post Note  Patient: Richard HartshornRobert E Richard Williams  Procedure(s) Performed: Posterior Lateral Fusion - L1 - L3, segmental fixation L1-3 (N/A Back)     Patient location during evaluation: PACU Anesthesia Type: General Level of consciousness: awake and alert, patient cooperative and oriented Pain management: pain level controlled Vital Signs Assessment: post-procedure vital signs reviewed and stable Respiratory status: spontaneous breathing, nonlabored ventilation, respiratory function stable and patient connected to nasal cannula oxygen Cardiovascular status: blood pressure returned to baseline and stable Postop Assessment: no apparent nausea or vomiting Anesthetic complications: no    Last Vitals:  Vitals:   03/20/17 1431 03/20/17 1658  BP: 115/73 116/78  Pulse: 81 87  Resp: 18 18  Temp: 36.9 C (!) 36.4 C  SpO2: 100% 100%    Last Pain:  Vitals:   03/20/17 1350  TempSrc:   PainSc: 5                  Jaycelynn Knickerbocker,E. Shawn Carattini

## 2017-03-20 NOTE — H&P (Signed)
Subjective: Patient is a 76 y.o. male admitted for back pain. Onset of symptoms was several months ago, gradually worsening since that time.  The pain is rated severe, and is located at the across the lower back and radiates to legs occasionally. The pain is described as aching and occurs all day. The symptoms have been progressive. Symptoms are exacerbated by exercise. MRI or CT showed spondylosis/ scoliosis   Past Medical History:  Diagnosis Date  . Anxiety   . Arthritis   . BPH (benign prostatic hypertrophy)    on meds  . Depression   . Dysrhythmia    1 episode of AF but never came back  . Enlarged aorta (HCC)   . GERD (gastroesophageal reflux disease)    on meds  . Hypertension   . Neuromuscular disorder (HCC)     right groin with numbness states he had back surgery in hopes to correct this, but still there  . Peripheral vascular disease (HCC)    surface blood clot x1 top of left foot  . Pneumonia    years ago 5,    Past Surgical History:  Procedure Laterality Date  . BACK SURGERY    . EYE SURGERY Bilateral    cataracts  . HERNIA REPAIR    . INGUINAL HERNIA REPAIR Left 11/13/2015   Procedure: OPEN REPAIR LEFT INGUINAL HERNIA;  Surgeon: Glenna Fellows, MD;  Location: [facility];  Service: General;  Laterality: Left;  . LUMBAR LAMINECTOMY/DECOMPRESSION MICRODISCECTOMY N/A 02/10/2014   Procedure: Lumbar One to Two, Lumbar Two to Three Lumbar laminectomy for stenosis/epidural mass;  Surgeon: Tia Alert, MD;  Location: [facility];  Service: Neurosurgery;  Laterality: N/A;  L1-2 L2-3 Lumbar laminectomy for stenosis/epidural mass  . TONSILLECTOMY      Prior to Admission medications   Medication Sig Start Date End Date Taking? Authorizing Provider  aspirin 81 MG tablet Take 81 mg by mouth daily.   Yes [provider]  atenolol (TENORMIN) 25 MG tablet Take 25 mg by mouth daily.   Yes [provider]  azelastine (ASTELIN) 0.1 % nasal spray Place 1 spray into both  nostrils 2 (two) times daily. Use in each nostril as directed   Yes [provider]  buPROPion (WELLBUTRIN SR) 200 MG 12 hr tablet Take 200 mg by mouth 2 (two) times daily.   Yes [provider]  busPIRone (BUSPAR) 10 MG tablet Take 10 mg by mouth 2 (two) times daily.   Yes [provider]  cetirizine (ZYRTEC) 10 MG tablet Take 10 mg by mouth daily.   Yes [provider]  esomeprazole (NEXIUM) 20 MG capsule Take 20 mg by mouth daily at 12 noon.   Yes [provider]  fluticasone (FLONASE) 50 MCG/ACT nasal spray Place 1 spray into both nostrils daily after supper.   Yes [provider]  levofloxacin (LEVAQUIN) 750 MG tablet Take 750 mg daily by mouth.   Yes [provider]  metroNIDAZOLE (METROGEL) 1 % gel 1 APPLICATION TO FACE DAILY 12/27/16  Yes [provider]  montelukast (SINGULAIR) 10 MG tablet Take 10 mg by mouth at bedtime.   Yes [provider]  polyethylene glycol (MIRALAX / GLYCOLAX) packet Take 17 g daily by mouth.   Yes [provider]  terazosin (HYTRIN) 2 MG capsule Take 4 mg by mouth daily.   Yes [provider]  terazosin (HYTRIN) 2 MG capsule Take 4 mg by mouth at bedtime.   Yes [provider]  vitamin C (ASCORBIC ACID) 500 MG tablet Take 500 mg by mouth daily.   Yes [provider]  celecoxib (CELEBREX) 200 MG capsule Take 200 mg by mouth daily.    [provider]  HYDROmorphone (DILAUDID) 4 MG tablet Take 0.5-2 tablets (2-8 mg total) by mouth every 6 (six) hours as needed for moderate pain or severe pain. Patient not taking: Reported on 03/07/2017 11/13/15   Glenna FellowsHoxworth, Benjamin, MD  methocarbamol (ROBAXIN) 500 MG tablet Take 1 tablet (500 mg total) by mouth every 6 (six) hours as needed for muscle spasms. 11/13/15   Glenna FellowsHoxworth, Benjamin, MD   Allergies  Allergen Reactions  . Codeine Hives  . Other Itching and Other (See Comments)    OPIODS--All over itch   Including Laudanum  . Amoxicillin Other (See Comments)    Cold sores  Has patient had a PCN reaction causing immediate rash, facial/tongue/throat swelling, SOB or lightheadedness with hypotension: No Has patient had a PCN reaction causing severe rash involving mucus membranes or skin necrosis: Yes Has patient had a PCN reaction that required hospitalization: No Has patient had a PCN reaction occurring within the last 10 years: No If all of the above answers are "NO", then may proceed with Cephalosporin use.     Social History   Tobacco Use  . Smoking status: Never Smoker  . Smokeless tobacco: Never Used  Substance Use Topics  . Alcohol use: No    Comment: very rare    History reviewed. No pertinent family history.   Review of Systems  Positive ROS: neg  All other systems have been reviewed and were otherwise negative with the exception of those mentioned in the HPI and as above.  Objective: Vital signs in last 24 hours: Temp:  [97.6 F (36.4 C)] 97.6 F (36.4 C) (11/15 0850) Pulse Rate:  [77] 77 (11/15 0850) Resp:  [18] 18 (11/15 0850) BP: (135)/(80) 135/80 (11/15 0850) SpO2:  [95 %] 95 % (11/15 0850) Weight:  [91.9 kg (202 lb 9.6 oz)] 91.9 kg (202 lb 9.6 oz) (11/15 0850)  General Appearance: Alert, cooperative, no distress, appears stated age Head: Normocephalic, without obvious abnormality, atraumatic Eyes: PERRL, conjunctiva/corneas clear, EOM's intact    Neck: Supple, symmetrical, trachea midline Back: Symmetric, no curvature, ROM normal, no CVA tenderness Lungs:  respirations unlabored Heart: Regular rate and rhythm Abdomen: Soft, non-tender Extremities: Extremities normal, atraumatic, no cyanosis or edema Pulses: 2+ and symmetric all extremities Skin: Skin color, texture, turgor normal, no rashes or lesions  NEUROLOGIC:   Mental status: Alert and oriented x4,  no aphasia, good attention span, fund of knowledge, and memory Motor Exam - grossly  normal Sensory Exam - grossly normal Reflexes: 1+ Coordination - grossly normal Gait - grossly normal Balance - grossly normal Cranial Nerves: I: smell Not tested  II: visual acuity  OS: nl    OD: nl  II: visual fields Full to confrontation  II: pupils Equal, round, reactive to light  III,VII: ptosis None  III,IV,VI: extraocular muscles  Full ROM  V: mastication Normal  V: facial light touch sensation  Normal  V,VII: corneal reflex  Present  VII: facial muscle function - upper  Normal  VII: facial muscle function - lower Normal  VIII: hearing Not tested  IX: soft palate elevation  Normal  IX,X: gag reflex Present  XI: trapezius strength  5/5  XI: sternocleidomastoid strength 5/5  XI: neck flexion strength  5/5  XII: tongue strength  Normal    Data Review  Lab Results  Component Value Date   WBC 9.0 03/11/2017   HGB 14.0 03/11/2017   HCT 40.5 03/11/2017   MCV 93.5 03/11/2017   PLT 183 03/11/2017   Lab Results  Component Value Date   NA 138 03/11/2017   K 4.0 03/11/2017   CL 106 03/11/2017   CO2 24 03/11/2017   BUN 24 (H) 03/11/2017   CREATININE 1.29 (H) 03/11/2017   GLUCOSE 125 (H) 03/11/2017   Lab Results  Component Value Date   INR 1.03 03/11/2017    Assessment/Plan: Patient admitted for L1-3 fusion. Patient has failed a reasonable attempt at conservative therapy.  I explained the condition and procedure to the patient and answered any questions.  Patient wishes to proceed with procedure as planned. Understands risks/ benefits and typical outcomes of procedure.   Gabreille Dardis S 03/20/2017 9:34 AM

## 2017-03-20 NOTE — Anesthesia Preprocedure Evaluation (Addendum)
Anesthesia Evaluation  Patient identified by MRN, date of birth, ID band Patient awake    Reviewed: Allergy & Precautions, NPO status , Patient's Chart, lab work & pertinent test results  History of Anesthesia Complications Negative for: history of anesthetic complications  Airway Mallampati: II  TM Distance: >3 FB Neck ROM: Full    Dental  (+) Dental Advisory Given   Pulmonary COPD,  COPD inhaler,    breath sounds clear to auscultation       Cardiovascular hypertension, Pt. on medications and Pt. on home beta blockers  Rhythm:Regular Rate:Normal     Neuro/Psych Anxiety Depression Chronic back pain    GI/Hepatic Neg liver ROS, GERD  Medicated and Controlled,  Endo/Other  negative endocrine ROS  Renal/GU negative Renal ROS     Musculoskeletal  (+) Arthritis , Osteoarthritis,    Abdominal   Peds  Hematology negative hematology ROS (+)   Anesthesia Other Findings   Reproductive/Obstetrics                            Anesthesia Physical Anesthesia Plan  ASA: II  Anesthesia Plan: General   Post-op Pain Management:    Induction:   PONV Risk Score and Plan: 3 and Ondansetron, Dexamethasone and Midazolam  Airway Management Planned: Oral ETT  Additional Equipment:   Intra-op Plan:   Post-operative Plan: Extubation in OR  Informed Consent: I have reviewed the patients History and Physical, chart, labs and discussed the procedure including the risks, benefits and alternatives for the proposed anesthesia with the patient or authorized representative who has indicated his/her understanding and acceptance.   Dental advisory given  Plan Discussed with: CRNA and Surgeon  Anesthesia Plan Comments: (Plan routine monitors, GETA)        Anesthesia Quick Evaluation

## 2017-03-21 MED ORDER — TAPENTADOL HCL 50 MG PO TABS
50.0000 mg | ORAL_TABLET | ORAL | Status: DC | PRN
  Administered 2017-03-21: 50 mg via ORAL
  Filled 2017-03-21: qty 1

## 2017-03-21 MED ORDER — TAPENTADOL HCL 50 MG PO TABS: 50.0000 mg | ORAL_TABLET | ORAL | 0 refills | Status: DC | PRN

## 2017-03-21 MED ORDER — HYDROXYZINE HCL 25 MG PO TABS
25.0000 mg | ORAL_TABLET | Freq: Three times a day (TID) | ORAL | Status: DC | PRN
  Administered 2017-03-21: 25 mg via ORAL
  Filled 2017-03-21: qty 1

## 2017-03-21 NOTE — Progress Notes (Signed)
Physical Therapy Treatment Patient Details Name: Richard HartshornRobert E Williams MRN: [id number] DOB: April 05, 1941 Today's Date: 03/21/2017    History of Present Illness Pt is a 76 y/o male s/p L1-3 laminectomy and posterior fixation. PMH includes anxiety, enlarged aorta, HTN, PVD and back surgery.     PT Comments    Pt making good progress towards achieving his current functional mobility goals. PT will continue to follow acutely to ensure a safe d/c home.   Follow Up Recommendations  No PT follow up;Supervision for mobility/OOB     Equipment Recommendations  3in1 (PT)    Recommendations for Other Services       Precautions / Restrictions Precautions Precautions: Back Precaution Comments: Reviewed all back precautions and handout with pt Required Braces or Orthoses: Spinal Brace Spinal Brace: Lumbar corset;Applied in sitting position Restrictions Weight Bearing Restrictions: No    Mobility  Bed Mobility               General bed mobility comments: sitting EOB upon arrival  Transfers Overall transfer level: Needs assistance Equipment used: None Transfers: Sit to/from Stand Sit to Stand: Min guard         General transfer comment: min guard for safety  Ambulation/Gait Ambulation/Gait assistance: Min guard Ambulation Distance (Feet): 400 Feet Assistive device: None Gait Pattern/deviations: Step-through pattern;Decreased stride length;Drifts right/left Gait velocity: Decreased Gait velocity interpretation: Below normal speed for age/gender General Gait Details: mildly unsteady without use of an AD, but no overt LOB or need for physical assistance, min guard for safety   Stairs            Wheelchair Mobility    Modified Rankin (Stroke Patients Only)       Balance Overall balance assessment: Needs assistance Sitting-balance support: No upper extremity supported;Feet supported Sitting balance-Leahy Scale: Good     Standing balance support: During functional  activity;No upper extremity supported Standing balance-Leahy Scale: Fair                              Cognition Arousal/Alertness: Awake/alert Behavior During Therapy: WFL for tasks assessed/performed Overall Cognitive Status: Within Functional Limits for tasks assessed                                        Exercises      General Comments        Pertinent Vitals/Pain Pain Assessment: No/denies pain    Home Living                      Prior Function            PT Goals (current goals can now be found in the care plan section) Acute Rehab PT Goals PT Goal Formulation: With patient Time For Goal Achievement: 03/27/17 Potential to Achieve Goals: Good Progress towards PT goals: Progressing toward goals    Frequency    Min 5X/week      PT Plan Current plan remains appropriate    Co-evaluation              AM-PAC PT "6 Clicks" Daily Activity  Outcome Measure  Difficulty turning over in bed (including adjusting bedclothes, sheets and blankets)?: A Little Difficulty moving from lying on back to sitting on the side of the bed? : A Little Difficulty sitting down on and standing up from a  chair with arms (e.g., wheelchair, bedside commode, etc,.)?: A Little Help needed moving to and from a bed to chair (including a wheelchair)?: A Little Help needed walking in hospital room?: A Little Help needed climbing 3-5 steps with a railing? : A Lot 6 Click Score: 17    End of Session Equipment Utilized During Treatment: Gait belt;Back brace Activity Tolerance: Patient tolerated treatment well Patient left: in bed;with call bell/phone within reach;with family/visitor present;Other (comment)(sitting EOB) Nurse Communication: Mobility status PT Visit Diagnosis: Unsteadiness on feet (R26.81);Other abnormalities of gait and mobility (R26.89);Pain Pain - part of body: (back)     Time: [id number] PT Time Calculation (min) (ACUTE ONLY):  11 min  Charges:  $Gait Training: 8-22 mins                    G Codes:       PlainvilleJennifer Josia Cueva, [place]PT, [place]PT [phone]    Richard BevelsJennifer M Deanne Williams 03/21/2017, 10:09 AM

## 2017-03-21 NOTE — Evaluation (Signed)
Occupational Therapy Evaluation Patient Details Name: Richard HartshornRobert E Williams MRN: [id number] DOB: 19-Jul-1940 Today's Date: 03/21/2017    History of Present Illness Pt is a 76 y/o male s/p L1-3 laminectomy and posterior fixation. PMH includes anxiety, enlarged aorta, HTN, PVD and back surgery.    Clinical Impression   Pt reports he was independent with ADL PTA. Currently pt min guard with ADL and functional mobility with the exception of min assist for LB ADL. Began back, safety, and ADL education with pt and wife. Pt planning to d/c home with 24/7 supervision from family. Pt would benefit from continued skilled OT to address established goals.     Follow Up Recommendations  No OT follow up;Supervision/Assistance - 24 hour    Equipment Recommendations  3 in 1 bedside commode    Recommendations for Other Services       Precautions / Restrictions Precautions Precautions: Back Precaution Booklet Issued: No Precaution Comments: Pt able to recall back precautions Required Braces or Orthoses: Spinal Brace Spinal Brace: Lumbar corset;Applied in sitting position Restrictions Weight Bearing Restrictions: No      Mobility Bed Mobility               General bed mobility comments: Pt sitting EOB upon arrival  Transfers Overall transfer level: Needs assistance Equipment used: None Transfers: Sit to/from Stand Sit to Stand: Min guard         General transfer comment: for safety, no physical assist required    Balance Overall balance assessment: Needs assistance Sitting-balance support: No upper extremity supported;Feet supported Sitting balance-Leahy Scale: Good     Standing balance support: No upper extremity supported;During functional activity Standing balance-Leahy Scale: Fair                             ADL either performed or assessed with clinical judgement   ADL Overall ADL's : Needs assistance/impaired Eating/Feeding: Set up;Sitting   Grooming:  Supervision/safety;Standing Grooming Details (indicate cue type and reason): Educated on use of 2 cups for oral care Upper Body Bathing: Supervision/ safety;Sitting   Lower Body Bathing: Minimal assistance;Sit to/from stand   Upper Body Dressing : Supervision/safety;Sitting Upper Body Dressing Details (indicate cue type and reason): to don brace Lower Body Dressing: Minimal assistance;Sit to/from stand Lower Body Dressing Details (indicate cue type and reason): to start clothing over feet. Wife to assist as needed upon return home Toilet Transfer: Min guard;Ambulation;Comfort height toilet Toilet Transfer Details (indicate cue type and reason): Simulated by sit to stand from EOB with functional mobility   Toileting - Clothing Manipulation Details (indicate cue type and reason): Educated on proper technique for peri care without twsiting and use of wet wipes   Tub/Shower Transfer Details (indicate cue type and reason): Educated on use of 3 in 1 in shower as a seat Functional mobility during ADLs: Min guard General ADL Comments: Educated pt on maintaining back precautions during functional activities, keeping frequently used items at counter top height, and log roll for bed mobility     Vision         Perception     Praxis      Pertinent Vitals/Pain Pain Assessment: Faces Faces Pain Scale: Hurts a little bit Pain Location: back Pain Descriptors / Indicators: Sore Pain Intervention(s): Monitored during session     Hand Dominance     Extremity/Trunk Assessment Upper Extremity Assessment Upper Extremity Assessment: Overall WFL for tasks assessed   Lower Extremity Assessment Lower  Extremity Assessment: Defer to PT evaluation   Cervical / Trunk Assessment Cervical / Trunk Assessment: Other exceptions Cervical / Trunk Exceptions: s/p fusion    Communication Communication Communication: No difficulties   Cognition Arousal/Alertness: Awake/alert Behavior During Therapy: WFL  for tasks assessed/performed Overall Cognitive Status: Within Functional Limits for tasks assessed                                     General Comments       Exercises     Shoulder Instructions      Home Living Family/patient expects to be discharged to:: Private residence Living Arrangements: Spouse/significant other Available Help at Discharge: Family;Available 24 hours/day Type of Home: House Home Access: Stairs to enter Entergy CorporationEntrance Stairs-Number of Steps: 4 Entrance Stairs-Rails: Right Home Layout: One level     Bathroom Shower/Tub: Producer, television/film/videoWalk-in shower   Bathroom Toilet: Standard     Home Equipment: Cane - single point;Shower seat - built in          Prior Functioning/Environment Level of Independence: Independent with assistive device(s)        Comments: Reports he sometimes had to use cane with ambulation         OT Problem List: Impaired balance (sitting and/or standing);Decreased knowledge of use of DME or AE;Decreased knowledge of precautions;Pain      OT Treatment/Interventions: Self-care/ADL training;Energy conservation;DME and/or AE instruction;Therapeutic activities;Patient/family education;Balance training    OT Goals(Current goals can be found in the care plan section) Acute Rehab OT Goals Patient Stated Goal: to go home  OT Goal Formulation: With patient/family Time For Goal Achievement: 04/04/17 Potential to Achieve Goals: Good ADL Goals Pt Will Transfer to Toilet: with supervision;bedside commode;ambulating Pt Will Perform Toileting - Clothing Manipulation and hygiene: with supervision;sit to/from stand Pt Will Perform Tub/Shower Transfer: Shower transfer;with supervision;ambulating;3 in 1 Additional ADL Goal #1: Pt will independently verbally recall 3/3 back precautions and maintain throughout ADL. Additional ADL Goal #2: Pt will perform log roll for bed mobility with supervision.  OT Frequency: Min 2X/week   Barriers to D/C:             Co-evaluation              AM-PAC PT "6 Clicks" Daily Activity     Outcome Measure Help from another person eating meals?: None Help from another person taking care of personal grooming?: A Little Help from another person toileting, which includes using toliet, bedpan, or urinal?: A Little Help from another person bathing (including washing, rinsing, drying)?: A Little Help from another person to put on and taking off regular upper body clothing?: None Help from another person to put on and taking off regular lower body clothing?: A Little 6 Click Score: 20   End of Session Equipment Utilized During Treatment: Back brace Nurse Communication: Mobility status;Other (comment)(equipment and f/u needs)  Activity Tolerance: Patient tolerated treatment well Patient left: Other (comment);with family/visitor present(sitting EOB)  OT Visit Diagnosis: Unsteadiness on feet (R26.81);Pain Pain - part of body: (back)                Time: [id number] OT Time Calculation (min): 18 min Charges:  OT General Charges $OT Visit: 1 Visit OT Evaluation $OT Eval Moderate Complexity: 1 Mod G-Codes:     Wes Lezotte A. Brett Albinooffey, M.S., Richard Williams Pager: [phone]  Richard Williams 03/21/2017, 10:30 AM

## 2017-03-21 NOTE — Progress Notes (Signed)
Patient alert and oriented, mae's well, voiding adequate amount of urine, swallowing without difficulty, no c/o pain at time of discharge. Patient discharged home with family. Script and discharged instructions given to patient. Patient and family stated understanding of instructions given. Patient has an appointment with Dr. Jones °

## 2017-03-21 NOTE — Progress Notes (Signed)
Patient ID: Richard HartshornRobert E Williams, male   DOB: 1940-11-18, 76 y.o.   MRN: [id number] Subjective: The patient is alert and pleasant.  He had itching with fentanyl.  He has not tried Nucynta  Objective: Vital signs in last 24 hours: Temp:  [97.4 F (36.3 C)-98.6 F (37 C)] 97.9 F (36.6 C) (11/16 0400) Pulse Rate:  [63-103] 99 (11/16 0400) Resp:  [7-25] 20 (11/16 0400) BP: (89-135)/(44-81) 124/81 (11/16 0400) SpO2:  [94 %-100 %] 99 % (11/16 0400) FiO2 (%):  [13 %] 13 % (11/15 2011) Weight:  [91.9 kg (202 lb 9.6 oz)] 91.9 kg (202 lb 9.6 oz) (11/15 0850)  Intake/Output from previous day: 11/15 0701 - 11/16 0700 In: 2273 [P.O.:480; I.V.:1300] Out: 1500 [Urine:1000; Drains:400; Blood:100] Intake/Output this shift: No intake/output data recorded.  Physical exam the patient is alert and oriented.  His dressing is clean and dry.  He is moving his lower extremities well.  Lab Results: No results for input(s): WBC, HGB, HCT, PLT in the last 72 hours. BMET No results for input(s): NA, K, CL, CO2, GLUCOSE, BUN, CREATININE, CALCIUM in the last 72 hours.  Studies/Results: Dg Lumbar Spine 2-3 Views  Result Date: 03/20/2017 CLINICAL DATA:  Posterior-lateral fusion L1 through L3 EXAM: LUMBAR SPINE - 2-3 VIEW; DG C-ARM GT 120 MIN COMPARISON:  None. FINDINGS: AP and lateral C-arm images in the operating room. Bilateral pedicle screw and rod fusion is present at 3 lumbar vertebral levels. The level of the procedure cannot be accurately determined from these images. Follow-up radiographs suggested IMPRESSION: Lumbar fusion with pedicle screws and posterior rods. Follow-up radiographs suggested to confirm surgical level. Electronically Signed   By: Marlan Palauharles  Clark M.D.   On: 03/20/2017 12:49   Dg C-arm Gt 120 Min  Result Date: 03/20/2017 CLINICAL DATA:  Posterior-lateral fusion L1 through L3 EXAM: LUMBAR SPINE - 2-3 VIEW; DG C-ARM GT 120 MIN COMPARISON:  None. FINDINGS: AP and lateral C-arm images in the  operating room. Bilateral pedicle screw and rod fusion is present at 3 lumbar vertebral levels. The level of the procedure cannot be accurately determined from these images. Follow-up radiographs suggested IMPRESSION: Lumbar fusion with pedicle screws and posterior rods. Follow-up radiographs suggested to confirm surgical level. Electronically Signed   By: Marlan Palauharles  Clark M.D.   On: 03/20/2017 12:49    Assessment/Plan: Postop day 1: The patient is doing well.  We will mobilize him.  We will discontinue his Hemovac.  I will add Nucynta hopefully he can tolerate this without itching.  He may go home later today.  LOS: 1 day     Cristi LoronJeffrey D Gisela Lea 03/21/2017, 7:34 AM

## 2017-03-21 NOTE — Discharge Summary (Signed)
Physician Discharge Summary  Patient ID: Richard HartshornRobert E Williams MRN: [id number] DOB/AGE: [birth year] 76 y.o.  Admit date: 03/20/2017 Discharge date: 03/21/2017  Admission Diagnoses:  Lumbar radiculopathy  Discharge Diagnoses:  Same Active Problems:   S/P lumbar spinal fusion   Discharged Condition: Stable  Hospital Course:  Richard Williams is a 76 y.o. male who was admitted for the below procedure. There were no post operative complications. At time of discharge, pain was well controlled, ambulating with Pt/OT, tolerating po, voiding normal. Ready for discharge.  Treatments: Surgery 1. Posterior fixation L1-L3 using Alphatec cortical pedicle screws.  2. Intertransverse arthrodesis L1-L3 using locally harvested morcellized autograft and allograft soaked with bone marrow aspirate.    Discharge Exam: Blood pressure 118/83, pulse 95, temperature 98.4 F (36.9 C), temperature source Oral, resp. rate 18, height 6' (1.829 m), weight 91.9 kg (202 lb 9.6 oz), SpO2 93 %. Awake, alert, oriented Speech fluent, appropriate CN grossly intact MAEW with good strength Wound c/d/i  Disposition: 01-Home or Self Care  Discharge Instructions    Call MD for:  difficulty breathing, headache or visual disturbances   Complete by:  As directed    Call MD for:  persistant dizziness or light-headedness   Complete by:  As directed    Call MD for:  redness, tenderness, or signs of infection (pain, swelling, redness, odor or green/yellow discharge around incision site)   Complete by:  As directed    Call MD for:  severe uncontrolled pain   Complete by:  As directed    Call MD for:  temperature >100.4   Complete by:  As directed    Diet general   Complete by:  As directed    Driving Restrictions   Complete by:  As directed    Do not drive until given clearance.   Increase activity slowly   Complete by:  As directed    Lifting restrictions   Complete by:  As directed    Do not lift anything >10lbs.  Avoid bending and twisting in awkward positions. Avoid bending at the back.   May shower / Bathe   Complete by:  As directed    In 24 hours. Okay to wash wound with warm soapy water. Avoid scrubbing the wound. Pat dry.   Remove dressing in 24 hours   Complete by:  As directed      Allergies as of 03/21/2017      Reactions   Codeine Hives   Other Itching, Other (See Comments)   OPIODS--All over itch  Including Laudanum   Amoxicillin Other (See Comments)   Cold sores  Has patient had a PCN reaction causing immediate rash, facial/tongue/throat swelling, SOB or lightheadedness with hypotension: No Has patient had a PCN reaction causing severe rash involving mucus membranes or skin necrosis: Yes Has patient had a PCN reaction that required hospitalization: No Has patient had a PCN reaction occurring within the last 10 years: No If all of the above answers are "NO", then may proceed with Cephalosporin use.      Medication List    STOP taking these medications   HYDROmorphone 4 MG tablet Commonly known as:  DILAUDID     TAKE these medications   aspirin 81 MG tablet Take 81 mg by mouth daily.   atenolol 25 MG tablet Commonly known as:  TENORMIN Take 25 mg by mouth daily.   azelastine 0.1 % nasal spray Commonly known as:  ASTELIN Place 1 spray into both nostrils 2 (two) times  daily. Use in each nostril as directed   buPROPion 200 MG 12 hr tablet Commonly known as:  WELLBUTRIN SR Take 200 mg by mouth 2 (two) times daily.   busPIRone 10 MG tablet Commonly known as:  BUSPAR Take 10 mg by mouth 2 (two) times daily.   celecoxib 200 MG capsule Commonly known as:  CELEBREX Take 200 mg by mouth daily.   cetirizine 10 MG tablet Commonly known as:  ZYRTEC Take 10 mg by mouth daily.   esomeprazole 20 MG capsule Commonly known as:  NEXIUM Take 20 mg by mouth daily at 12 noon.   fluticasone 50 MCG/ACT nasal spray Commonly known as:  FLONASE Place 1 spray into both nostrils  daily after supper.   levofloxacin 750 MG tablet Commonly known as:  LEVAQUIN Take 750 mg daily by mouth.   methocarbamol 500 MG tablet Commonly known as:  ROBAXIN Take 1 tablet (500 mg total) by mouth every 6 (six) hours as needed for muscle spasms.   metroNIDAZOLE 1 % gel Commonly known as:  METROGEL 1 APPLICATION TO FACE DAILY   montelukast 10 MG tablet Commonly known as:  SINGULAIR Take 10 mg by mouth at bedtime.   polyethylene glycol packet Commonly known as:  MIRALAX / GLYCOLAX Take 17 g daily by mouth.   tapentadol 50 MG tablet Commonly known as:  NUCYNTA Take 1-2 tablets (50-100 mg total) every 4 (four) hours as needed by mouth for moderate pain or severe pain (1 PO q4 hours mild/moderate pain; 2 PO q4 hours severe pain).   terazosin 2 MG capsule Commonly known as:  HYTRIN Take 4 mg by mouth daily.   vitamin C 500 MG tablet Commonly known as:  ASCORBIC ACID Take 500 mg by mouth daily.            Durable Medical Equipment  (From admission, onward)        Start     Ordered   03/20/17 1435  DME Walker rolling  Once    Question:  Patient needs a walker to treat with the following condition  Answer:  S/P lumbar fusion   03/20/17 1434   03/20/17 1435  DME 3 n 1  Once     03/20/17 1434     Follow-up Information    Tia AlertJones, David S, MD Follow up.   Specialty:  Neurosurgery Contact information: [address] [place] [address] [phone]           Signed: Alyson InglesCOSTELLA, VINCENT J 03/21/2017, 3:05 PM

## 2017-03-24 ENCOUNTER — Encounter (HOSPITAL_COMMUNITY): Payer: Self-pay | Admitting: Neurological Surgery

## 2017-07-23 ENCOUNTER — Other Ambulatory Visit: Payer: Self-pay | Admitting: Internal Medicine

## 2017-07-23 ENCOUNTER — Ambulatory Visit
Admission: RE | Admit: 2017-07-23 | Discharge: 2017-07-23 | Disposition: A | Discharge status: Home / Self Care | Payer: Medicare Other | Source: Ambulatory Visit | Attending: Internal Medicine | Admitting: Internal Medicine

## 2017-07-23 DIAGNOSIS — R059 Cough, unspecified: Secondary | ICD-10-CM

## 2017-07-23 DIAGNOSIS — R05 Cough: Secondary | ICD-10-CM

## 2017-08-27 ENCOUNTER — Ambulatory Visit
Admission: RE | Admit: 2017-08-27 | Discharge: 2017-08-27 | Disposition: A | Discharge status: Home / Self Care | Payer: Medicare Other | Source: Ambulatory Visit | Attending: Internal Medicine | Admitting: Internal Medicine

## 2017-08-27 ENCOUNTER — Other Ambulatory Visit: Payer: Self-pay | Admitting: Internal Medicine

## 2017-08-27 DIAGNOSIS — J189 Pneumonia, unspecified organism: Secondary | ICD-10-CM

## 2018-03-30 IMAGING — RF DG LUMBAR SPINE 2-3V
1 series · 3 of 3 positions shown · non-contrast
Comparison: None.

CLINICAL DATA: Posterior-lateral fusion L1 through L3

EXAM:
LUMBAR SPINE - 2-3 VIEW; DG C-ARM GT 120 MIN

[Series 1: run · 3 of 3 slices shown]
[im 1/3]
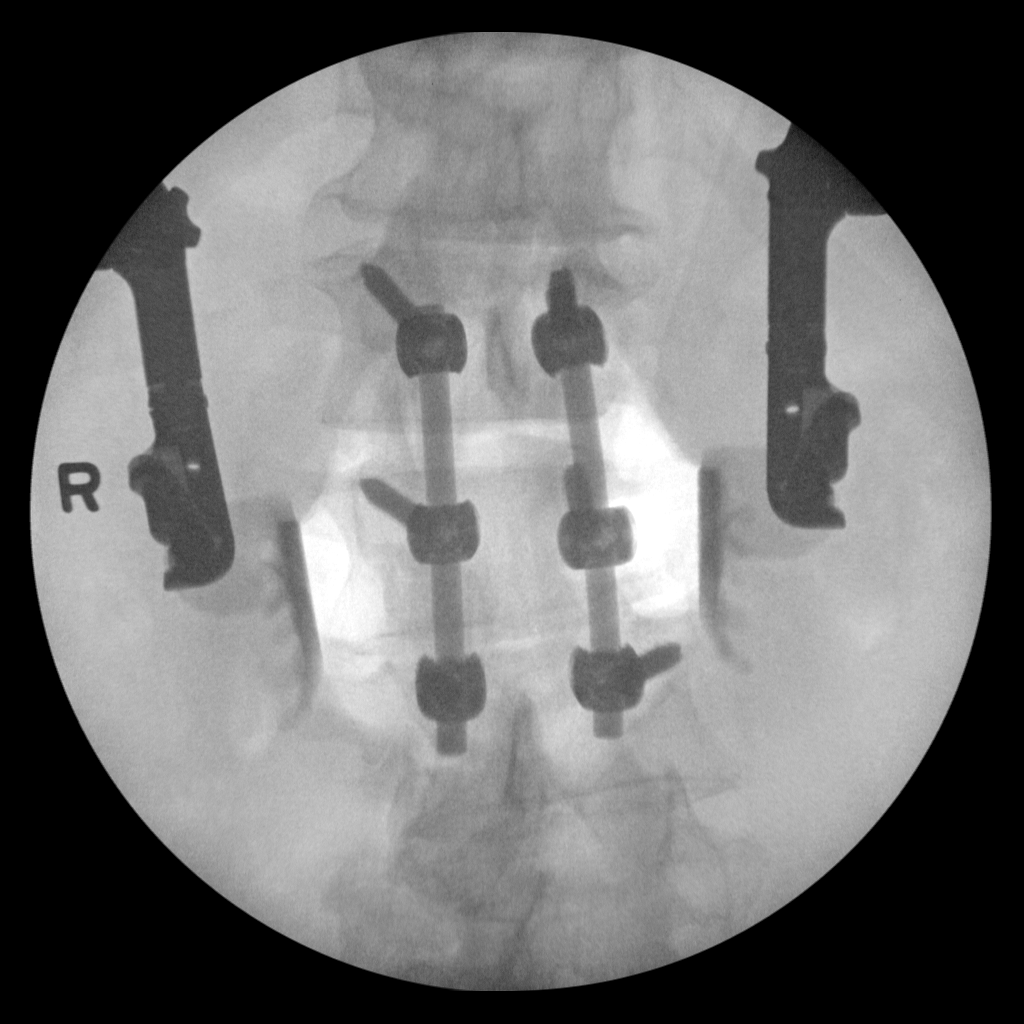
[im 2/3]
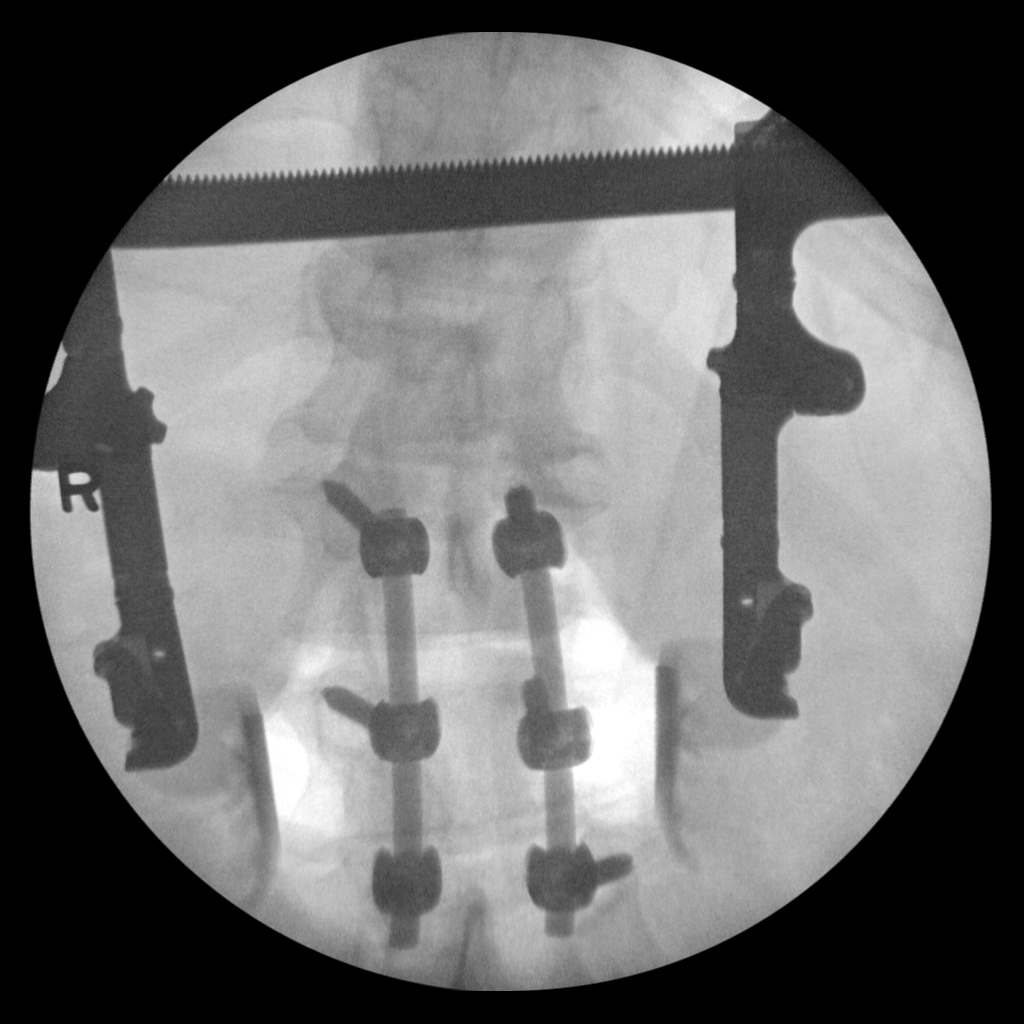
[im 3/3]
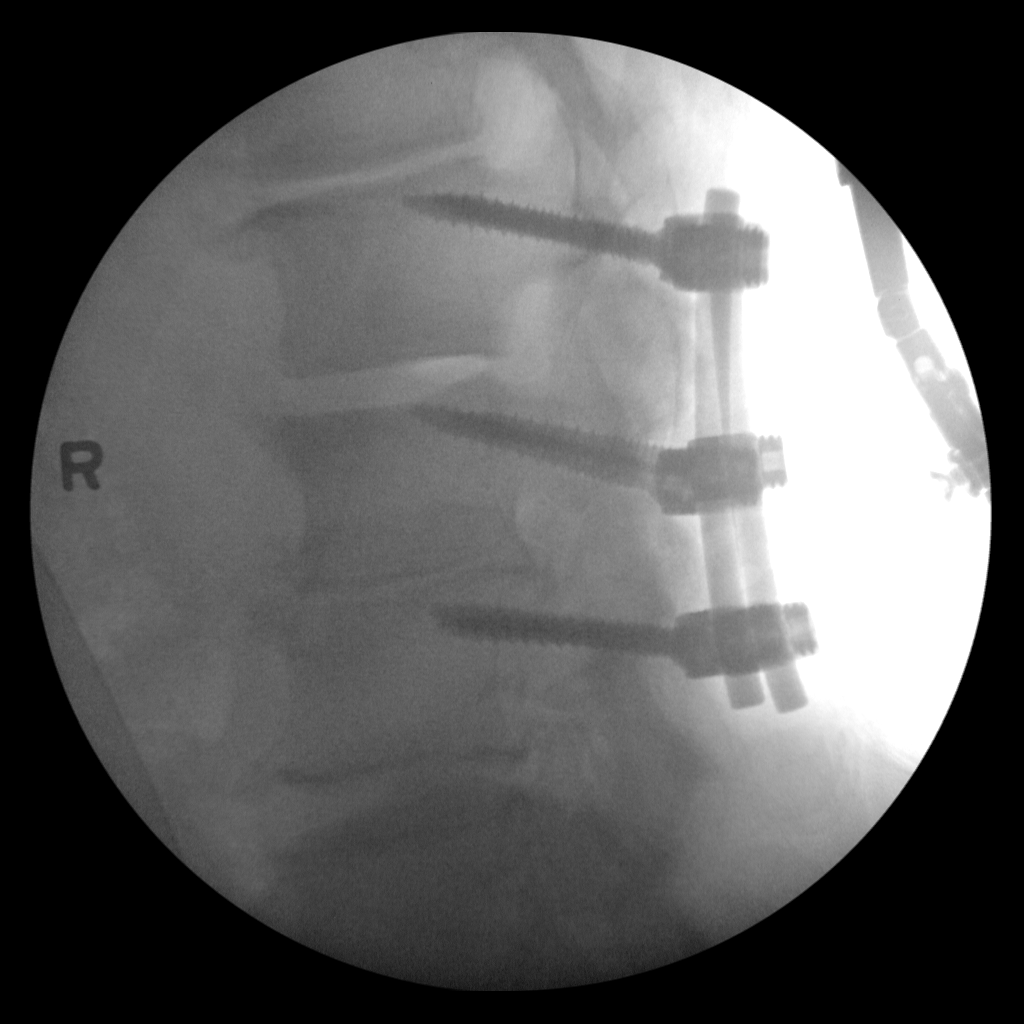

[3 of 3 positions shown; findings below may reference images not displayed]

FINDINGS: AP and lateral C-arm images in the operating room. Bilateral pedicle
screw and rod fusion is present at 3 lumbar vertebral levels. The
level of the procedure cannot be accurately determined from these
images. Follow-up radiographs suggested
IMPRESSION: Lumbar fusion with pedicle screws and posterior rods. Follow-up
radiographs suggested to confirm surgical level.

## 2018-08-02 IMAGING — CR DG CHEST 2V
2 series · 2 of 2 positions shown · non-contrast
Comparison: 03/17/2017 and earlier.

CLINICAL DATA: 76-year-old with 1 week of productive cough.

EXAM:
CHEST - 2 VIEW

[w chest pa]
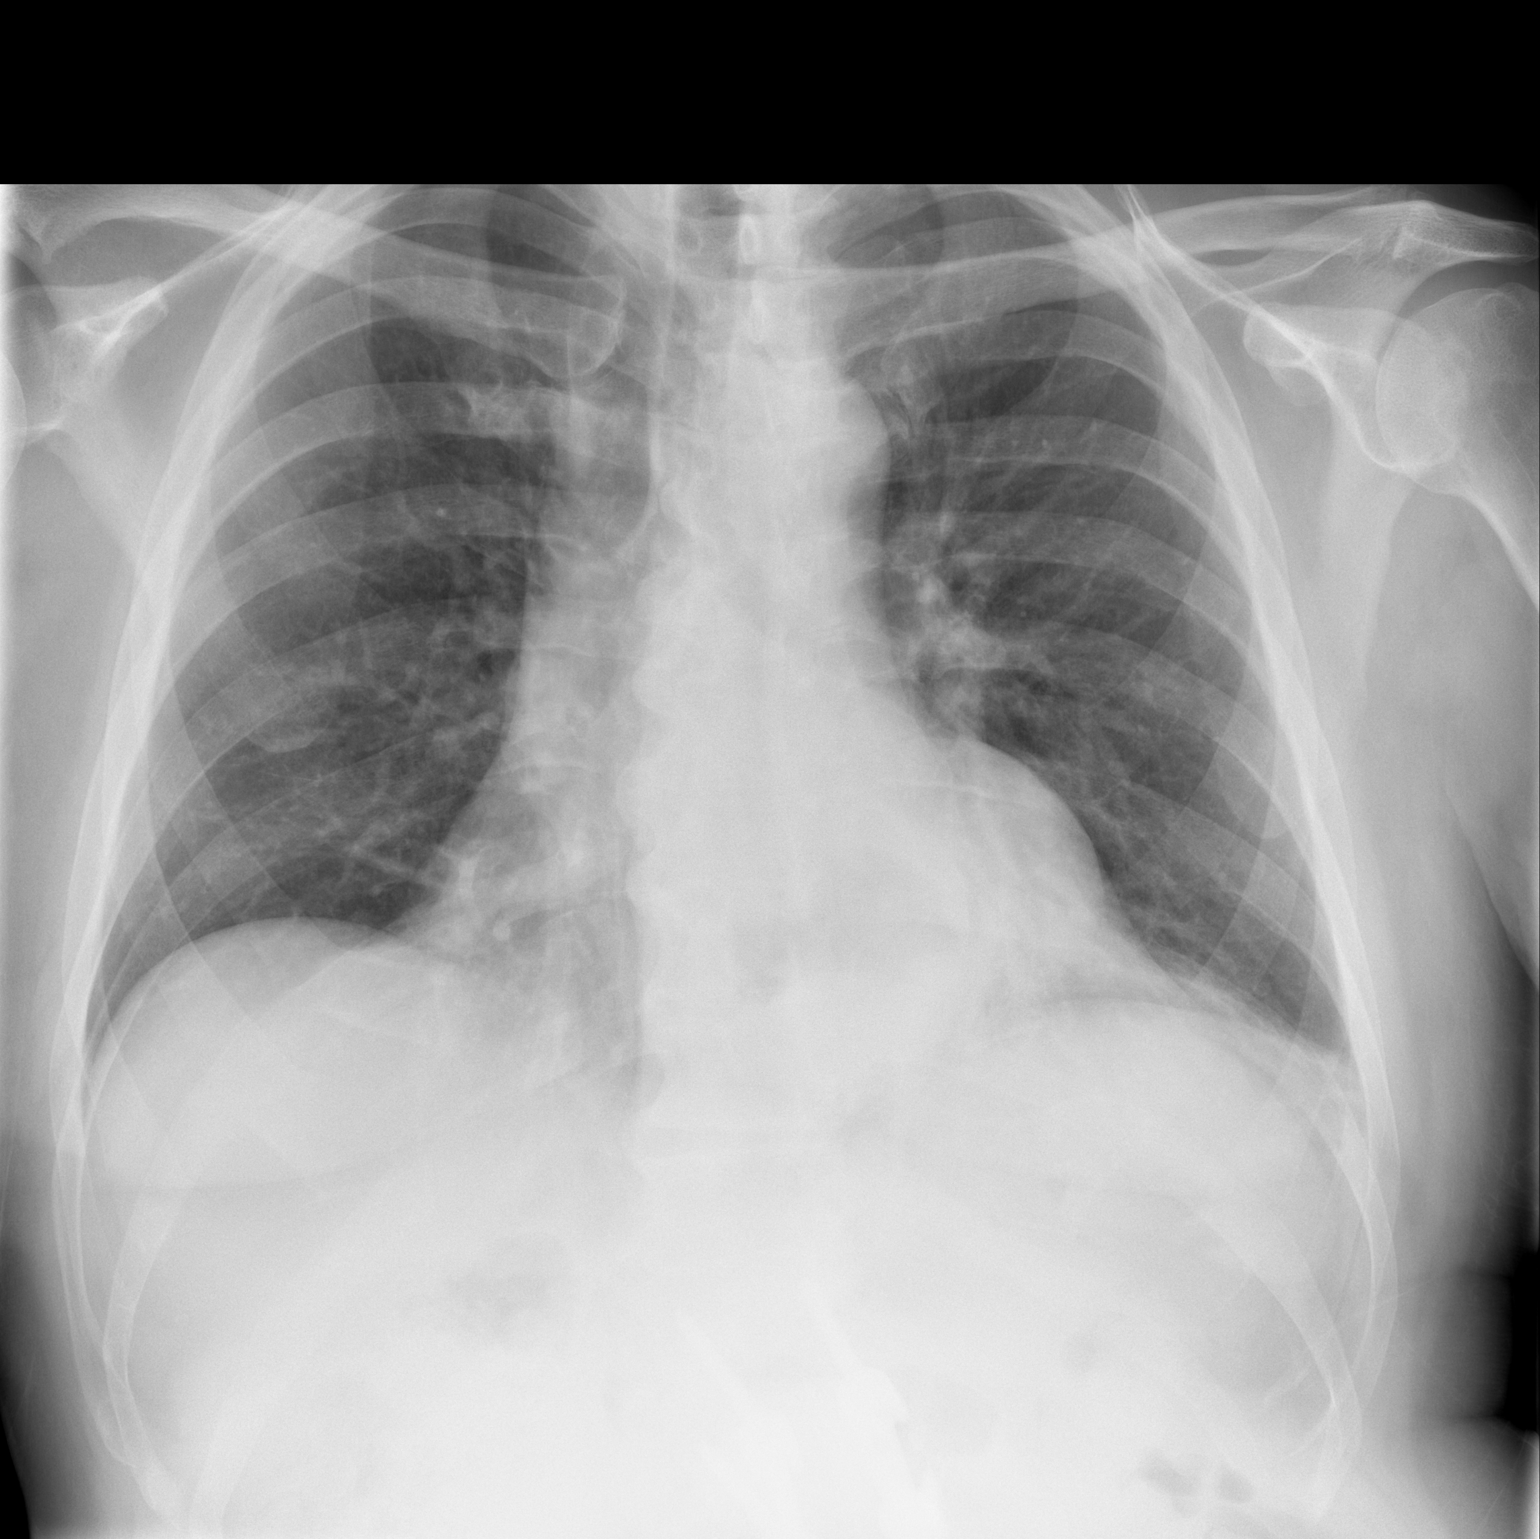

[w chest lat]
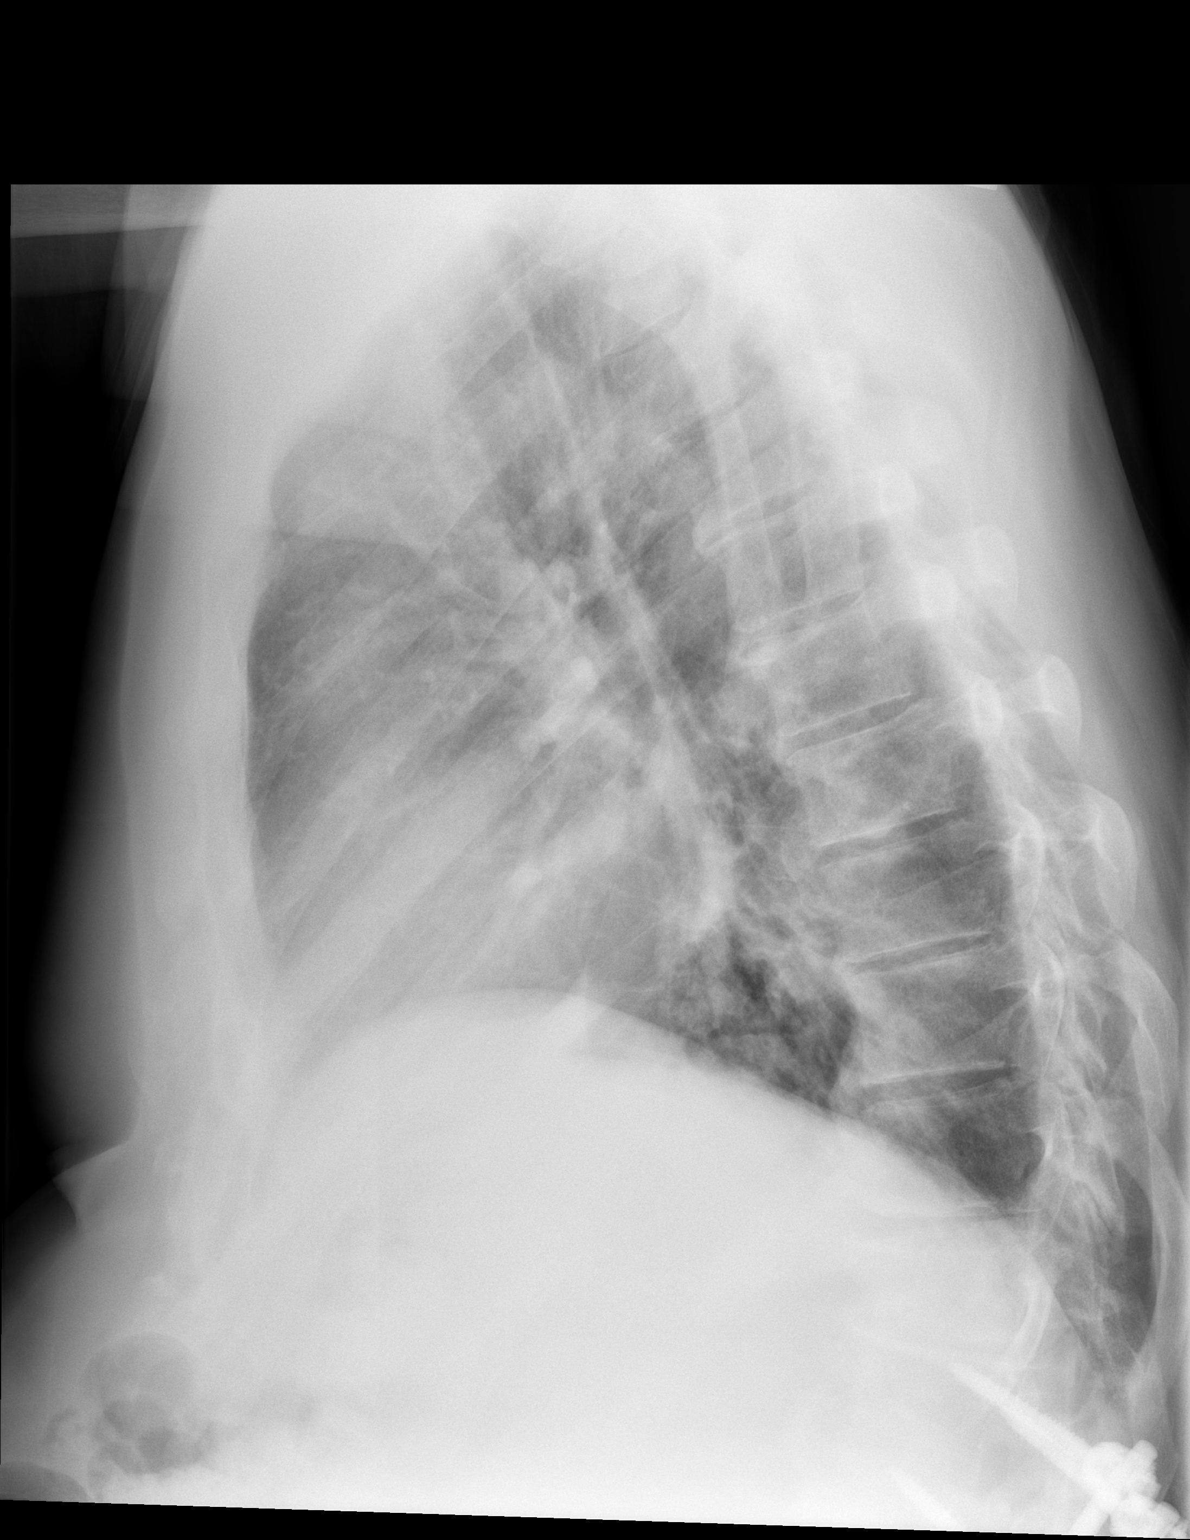

[2 of 2 positions shown; findings below may reference images not displayed]

FINDINGS: Chronic tortuosity of the thoracic aorta and mediastinal contours
are stable. There is increased peribronchial left lower lobe opacity
on both views. No pleural effusion identified. Lung parenchyma
elsewhere appears stable. No pneumothorax or pulmonary edema.
Visualized tracheal air column is within normal limits. Partially
visible lumbar spinal fusion hardware. No acute osseous abnormality
identified. Negative visible bowel gas pattern.
IMPRESSION: 1. Confluent left lower lobe opacity suspicious for
Bronchopneumonia/Pneumonia in this clinical setting. No pleural
effusion.
Followup PA and lateral chest X-ray is recommended in 3-4 weeks
following trial of antibiotic therapy to ensure resolution and
exclude underlying malignancy.
2. Otherwise stable chest.

## 2018-11-17 NOTE — Progress Notes (Signed)
Office Visit Note  Patient: Richard HartshornRobert E Bauers             Date of Birth: [birth year]           MRN: [id number]             PCP: Burton Apleyoberts, Ronald, MD Referring: Burton Apleyoberts, Ronald, MD Visit Date: 12/01/2018 Occupation: Retired, [employer] purchasing manager  Subjective:  Left thumb pain   History of Present Illness: Richard HartshornRobert E Hieronymus is a 78 y.o. male seen in consultation per request of his PCP.  According to patient he started having pain and discomfort in his left thumb about 5 months ago.  He has noticed a knot on the inner side of his thumb base.  He states he is constantly in pain because of it.  He is unable to completely flex his thumb.  None of the other fingers are painful.  He has had longstanding history of disc disease of lumbar spine for which he has had laminectomy and spinal fusion by Dr. Yetta BarreJones.  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  Which responded to physical therapy.  Activities of Daily Living:  Patient reports morning stiffness for 0 minute.   Patient Denies nocturnal pain.  Difficulty dressing/grooming: Denies Difficulty climbing stairs: Denies Difficulty getting out of chair: Denies Difficulty using hands for taps, buttons, cutlery, and/or writing: Denies  Review of Systems  Constitutional: Negative for fatigue and night sweats.  HENT: Positive for mouth dryness. Negative for mouth sores and nose dryness.   Eyes: Negative for redness and dryness.  Respiratory: Negative for shortness of breath and difficulty breathing.   Cardiovascular: Negative for chest pain, palpitations, hypertension, irregular heartbeat and swelling in legs/feet.  Gastrointestinal: Positive for constipation. Negative for diarrhea.  Endocrine: Negative for increased urination.  Genitourinary: Positive for urgency.  Musculoskeletal: Positive for arthralgias and joint pain. Negative for joint swelling, myalgias, muscle weakness, morning stiffness, muscle  tenderness and myalgias.  Skin: Negative for color change, rash, hair loss, nodules/bumps, skin tightness, ulcers and sensitivity to sunlight.  Allergic/Immunologic: Negative for susceptible to infections.  Neurological: Negative for dizziness, fainting, memory loss, night sweats and weakness ( ).  Hematological: Negative for swollen glands.  Psychiatric/Behavioral: Positive for depressed mood. Negative for sleep disturbance. The patient is nervous/anxious.     PMFS History:  Patient Active Problem List   Diagnosis Date Noted  . 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 Recurrent left inguinal hernia 11/13/2015  . S/P lumbar laminectomy 02/10/2014    Past Medical History:  Diagnosis Date  . Anxiety   . Arthritis   . BPH (benign prostatic hypertrophy)    on meds  . Depression   . Dysrhythmia    1 episode of AF but never came back  . Enlarged aorta (HCC)   . GERD (gastroesophageal reflux disease)    on meds  . Hypertension   . Neuromuscular disorder (HCC)     right groin with numbness states he had back surgery in hopes to correct this, but still there  . Peripheral vascular disease (HCC)    surface blood clot x1 top of left foot  . Pneumonia    years ago 5,    Family History  Problem Relation Age of Onset  . Stroke Mother   . Heart Problems Father   . Macular degeneration Sister   . Healthy Daughter    Past Surgical History:  Procedure Laterality Date  . BACK SURGERY    .  EYE SURGERY Bilateral    cataracts  . HERNIA REPAIR    . INGUINAL HERNIA REPAIR Left 11/13/2015   Procedure: OPEN REPAIR LEFT INGUINAL HERNIA;  Surgeon: Glenna FellowsBenjamin Hoxworth, MD;  [facility];  Service: General;  Laterality: Left;  . LAMINECTOMY WITH POSTERIOR LATERAL ARTHRODESIS LEVEL 2 N/A 03/20/2017   Procedure: Posterior Lateral Fusion - L1 - L3, segmental fixation L1-3;  Surgeon: Tia AlertJones, David S, MD;  [facility];  Service: Neurosurgery;  Laterality: N/A;  . LUMBAR LAMINECTOMY/DECOMPRESSION  MICRODISCECTOMY N/A 02/10/2014   Procedure: Lumbar One to Two, Lumbar Two to Three Lumbar laminectomy for stenosis/epidural mass;  Surgeon: Tia Alertavid S Jones, MD;  Location: [facility];  Service: Neurosurgery;  Laterality: N/A;  L1-2 L2-3 Lumbar laminectomy for stenosis/epidural mass  . TONSILLECTOMY     Social History   Social History Narrative  . Not on file    There is no immunization history on file for this patient.   Objective: Vital Signs: BP 130/84 (BP Location: Right Arm, Patient Position: Sitting, Cuff Size: Normal)   Pulse (!) 102   Resp 13   Ht 5' 10.75" (1.797 m)   Wt 202 lb (91.6 kg)   BMI 28.37 kg/m    Physical Exam Vitals signs and nursing note reviewed.  Constitutional:      Appearance: He is well-developed.  HENT:     Head: Normocephalic and atraumatic.  Eyes:     Conjunctiva/sclera: Conjunctivae normal.     Pupils: Pupils are equal, round, and reactive to light.  Neck:     Musculoskeletal: Normal range of motion and neck supple.  Cardiovascular:     Rate and Rhythm: Normal rate and regular rhythm.     Heart sounds: Normal heart sounds.  Pulmonary:     Effort: Pulmonary effort is normal.     Breath sounds: Normal breath sounds.  Abdominal:     General: Bowel sounds are normal.     Palpations: Abdomen is soft.  Skin:    General: Skin is warm and dry.     Capillary Refill: Capillary refill takes less than 2 seconds.  Neurological:     Mental Status: He is alert and oriented to person, place, and time.  Psychiatric:        Behavior: Behavior normal.      Musculoskeletal Exam: C-spine good range of motion.  He has limited range of motion of his lumbar spine with discomfort.  Shoulder joints, elbow joints, wrist joints with good range of motion.  He had some DIP thickening in his bilateral hands.  He had left trigger thumb.  Knee joints, hip joints and ankles with good range of motion with no synovitis.  CDAI Exam: CDAI Score: - Patient Global: -;  Provider Global: - Swollen: -; Tender: - Joint Exam   No joint exam has been documented for this visit   There is currently no information documented on the homunculus. Go to the Rheumatology activity and complete the homunculus joint exam.  Investigation: No additional findings.  Imaging: No results found.  Recent Labs: Lab Results  Component Value Date   WBC 9.0 03/11/2017   HGB 14.0 03/11/2017   PLT 183 03/11/2017   NA 138 03/11/2017   K 4.0 03/11/2017   CL 106 03/11/2017   CO2 24 03/11/2017   GLUCOSE 125 (H) 03/11/2017   BUN 24 (H) 03/11/2017   CREATININE 1.29 (H) 03/11/2017   CALCIUM 9.2 03/11/2017   GFRAA >60 03/11/2017    Speciality Comments:  No specialty comments available.  Procedures:  Hand/UE Inj: L thumb A1 for trigger finger on 12/01/2018 9:23 AM Indications: pain, tendon swelling and therapeutic Details: 27 G needle, ultrasound-guided volar approach Medications: 0.5 mL lidocaine 1 %; 10 mg triamcinolone acetonide 40 MG/ML Aspirate: 0 mL Immediately prior to procedure a time out was called to verify the correct patient, procedure, equipment, support staff and site/side marked as required. Patient was prepped and draped in the usual sterile fashion.     Allergies: Codeine, Other, and Amoxicillin   Assessment / Plan:     Visit Diagnoses: Pain of left thumb -patient has trigger thumb.  Detailed counseling was provided.  Different treatment options were discussed.  Patient wants to proceed with cortisone injection.  We will apply splint after the cortisone injection.  The thumb was injected with the cortisone as described above.  He tolerated the procedure well.  Postinjection precautions were discussed.  Use of Voltaren gel was discussed.  Side effects were reviewed.  Primary osteoarthritis of both hands -he has mild osteoarthritis which is not causing much discomfort.  S/P lumbar laminectomy -in the past by Dr. Ronnald Ramp.  S/P lumbar spinal fusion -he does  not have chronic pain since the fusion but he has limited mobility.  Anxiety and depression -he is on treatment.  Essential hypertension -his blood pressure is controlled.  History of hyperlipidemia -he is on treatment. Orders: Orders Placed This Encounter  Procedures  . Hand/UE Inj   No orders of the defined types were placed in this encounter.     Follow-Up Instructions: Return in about 2 months (around 02/01/2019) for Trigger finger, Osteoarthritis.   Bo Merino, MD  Note - This record has been created using Editor, commissioning.  Chart creation errors have been sought, but may not always  have been located. Such creation errors do not reflect on  the standard of medical care.

## 2018-11-27 ENCOUNTER — Encounter: Payer: Self-pay | Admitting: Rheumatology

## 2018-12-01 ENCOUNTER — Other Ambulatory Visit: Payer: Self-pay

## 2018-12-01 ENCOUNTER — Ambulatory Visit (INDEPENDENT_AMBULATORY_CARE_PROVIDER_SITE_OTHER): Payer: Medicare Other | Admitting: Rheumatology

## 2018-12-01 ENCOUNTER — Encounter: Payer: Self-pay | Admitting: Rheumatology

## 2018-12-01 VITALS — BP 130/84 | HR 102 | Resp 13 | Ht 70.75 in | Wt 202.0 lb

## 2018-12-01 DIAGNOSIS — M79645 Pain in left finger(s): Secondary | ICD-10-CM

## 2018-12-01 DIAGNOSIS — M19041 Primary osteoarthritis, right hand: Secondary | ICD-10-CM

## 2018-12-01 DIAGNOSIS — I10 Essential (primary) hypertension: Secondary | ICD-10-CM

## 2018-12-01 DIAGNOSIS — F419 Anxiety disorder, unspecified: Secondary | ICD-10-CM | POA: Diagnosis not present

## 2018-12-01 DIAGNOSIS — M19042 Primary osteoarthritis, left hand: Secondary | ICD-10-CM

## 2018-12-01 DIAGNOSIS — F32A Depression, unspecified: Secondary | ICD-10-CM

## 2018-12-01 DIAGNOSIS — F329 Major depressive disorder, single episode, unspecified: Secondary | ICD-10-CM

## 2018-12-01 DIAGNOSIS — Z8639 Personal history of other endocrine, nutritional and metabolic disease: Secondary | ICD-10-CM

## 2018-12-01 DIAGNOSIS — Z981 Arthrodesis status: Secondary | ICD-10-CM

## 2018-12-01 DIAGNOSIS — Z9889 Other specified postprocedural states: Secondary | ICD-10-CM | POA: Diagnosis not present

## 2018-12-01 MED ORDER — LIDOCAINE HCL 1 % IJ SOLN
0.5000 mL | INTRAMUSCULAR | Status: AC | PRN
  Administered 2018-12-01: .5 mL

## 2018-12-01 MED ORDER — TRIAMCINOLONE ACETONIDE 40 MG/ML IJ SUSP
10.0000 mg | INTRAMUSCULAR | Status: AC | PRN
  Administered 2018-12-01: 10 mg

## 2018-12-24 ENCOUNTER — Ambulatory Visit: Payer: Medicare Other | Admitting: Rheumatology

## 2019-01-22 NOTE — Progress Notes (Signed)
Office Visit Note  Patient: Richard HartshornRobert E Williams             Date of Birth: 01-30-1941           MRN: [id number]             PCP: Burton Apleyoberts, Ronald, MD Referring: Burton Apleyoberts, Ronald, MD Visit Date: 02/01/2019 Occupation: @GUAROCC @  Subjective:  Pain in left knee.   History of Present Illness: Richard Williams is a 78 y.o. male with history of left trigger thumb.  He states he had very good response to the cortisone injection and no recurrence of the symptoms.  He states soon after his last visit approximately 2 months ago he was kneeling on his left knee and this was sawing a tree.  He states after that he started having pain and discomfort in his left knee joint and his left ankle.  He states he has ongoing discomfort in his left knee and left ankle and sometimes discomfort in his left thigh.  He is very stiff in the morning and also gets stiff after prolonged sitting.  Patient states that he was seen by Dr. Ave Filterhandler about a month ago who did x-ray of his knee joint and the left ankle which was unremarkable.  He denies any joint swelling.  None of the other joints are painful.  Activities of Daily Living:  Patient reports morning stiffness for 10 minutes.   Patient Denies nocturnal pain.  Difficulty dressing/grooming: Denies Difficulty climbing stairs: Reports Difficulty getting out of chair: Reports Difficulty using hands for taps, buttons, cutlery, and/or writing: Denies  Review of Systems  Constitutional: Negative for fatigue and night sweats.  HENT: Negative for mouth sores, mouth dryness and nose dryness.   Eyes: Negative for redness, itching and dryness.  Respiratory: Negative for shortness of breath, wheezing and difficulty breathing.   Cardiovascular: Negative for chest pain, palpitations, hypertension, irregular heartbeat and swelling in legs/feet.  Gastrointestinal: Negative for blood in stool, constipation and diarrhea.  Endocrine: Negative for increased urination.  Genitourinary:  Negative for difficulty urinating and painful urination.  Musculoskeletal: Positive for arthralgias, joint pain and morning stiffness. Negative for joint swelling, myalgias, muscle weakness, muscle tenderness and myalgias.  Skin: Negative for color change, rash, hair loss, nodules/bumps, skin tightness, ulcers and sensitivity to sunlight.  Allergic/Immunologic: Negative for susceptible to infections.  Neurological: Negative for dizziness, fainting, headaches, memory loss, night sweats and weakness.  Hematological: Negative for swollen glands.  Psychiatric/Behavioral: Negative for depressed mood, confusion and sleep disturbance. The patient is not nervous/anxious.     PMFS History:  Patient Active Problem List   Diagnosis Date Noted  . 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 Recurrent left inguinal hernia 11/13/2015  . S/P lumbar laminectomy 02/10/2014    Past Medical History:  Diagnosis Date  . Anxiety   . Arthritis   . BPH (benign prostatic hypertrophy)    on meds  . Depression   . Dysrhythmia    1 episode of AF but never came back  . Enlarged aorta (HCC)   . GERD (gastroesophageal reflux disease)    on meds  . Hypertension   . Neuromuscular disorder (HCC)     right groin with numbness states he had back surgery in hopes to correct this, but still there  . Peripheral vascular disease (HCC)    surface blood clot x1 top of left foot  . Pneumonia    years ago 5,    Family History  Problem Relation Age  of Onset  . Stroke Mother   . Heart Problems Father   . Macular degeneration Sister   . Healthy Daughter    Past Surgical History:  Procedure Laterality Date  . BACK SURGERY    . EYE SURGERY Bilateral    cataracts  . HERNIA REPAIR    . INGUINAL HERNIA REPAIR Left 11/13/2015   Procedure: OPEN REPAIR LEFT INGUINAL HERNIA;  Surgeon: Excell Seltzer, MD;  Location: [place];  Service: General;  Laterality: Left;  . LAMINECTOMY WITH POSTERIOR LATERAL ARTHRODESIS LEVEL 2 N/A  03/20/2017   Procedure: Posterior Lateral Fusion - L1 - L3, segmental fixation L1-3;  Surgeon: Eustace Moore, MD;  Location: [place] [facility];  Service: Neurosurgery;  Laterality: N/A;  . LUMBAR LAMINECTOMY/DECOMPRESSION MICRODISCECTOMY N/A 02/10/2014   Procedure: Lumbar One to Two, Lumbar Two to Three Lumbar laminectomy for stenosis/epidural mass;  Surgeon: Eustace Moore, MD;  Location: [facility];  Service: Neurosurgery;  Laterality: N/A;  L1-2 L2-3 Lumbar laminectomy for stenosis/epidural mass  . TONSILLECTOMY     Social History   Social History Narrative  . Not on file    There is no immunization history on file for this patient.   Objective: Vital Signs: BP 124/81 (BP Location: Left Arm, Patient Position: Sitting, Cuff Size: Normal)   Pulse (!) 110   Resp 15   Ht 5\' 11"  (1.803 m)   Wt 198 lb 9.6 oz (90.1 kg)   BMI 27.70 kg/m    Physical Exam Vitals signs and nursing note reviewed.  Constitutional:      Appearance: He is well-developed.  HENT:     Head: Normocephalic and atraumatic.  Eyes:     Conjunctiva/sclera: Conjunctivae normal.     Pupils: Pupils are equal, round, and reactive to light.  Neck:     Musculoskeletal: Normal range of motion and neck supple.  Cardiovascular:     Rate and Rhythm: Normal rate and regular rhythm.     Heart sounds: Normal heart sounds.  Pulmonary:     Effort: Pulmonary effort is normal.     Breath sounds: Normal breath sounds.  Abdominal:     General: Bowel sounds are normal.     Palpations: Abdomen is soft.  Skin:    General: Skin is warm and dry.     Capillary Refill: Capillary refill takes less than 2 seconds.  Neurological:     Mental Status: He is alert and oriented to person, place, and time.  Psychiatric:        Behavior: Behavior normal.      Musculoskeletal Exam: He has limited range of motion of his lumbar spine.  Shoulder joints elbow joints wrist joints with good range of motion.  He had good flexion of his left thumb.  PIP  and DIP thickening was noted in his hands.  He had good range of motion of his hip joints.  He has discomfort range of motion of his left knee joint without any warmth swelling or effusion.  He has some tenderness over the anterior aspect of his distal tibia.  No ankle joint swelling was noted.  CDAI Exam: CDAI Score: - Patient Global: -; Provider Global: - Swollen: -; Tender: - Joint Exam   No joint exam has been documented for this visit   There is currently no information documented on the homunculus. Go to the Rheumatology activity and complete the homunculus joint exam.  Investigation: No additional findings.  Imaging: No results found.  Recent Labs: Lab Results  Component Value Date   WBC 9.0 03/11/2017   HGB 14.0 03/11/2017   PLT 183 03/11/2017   NA 138 03/11/2017   K 4.0 03/11/2017   CL 106 03/11/2017   CO2 24 03/11/2017   GLUCOSE 125 (H) 03/11/2017   BUN 24 (H) 03/11/2017   CREATININE 1.29 (H) 03/11/2017   CALCIUM 9.2 03/11/2017   GFRAA >60 03/11/2017    Speciality Comments: No specialty comments available.  Procedures:  Large Joint Inj: L knee on 02/01/2019 10:20 AM Indications: pain Details: 27 G 1.5 in needle, medial approach  Arthrogram: No  Medications: 40 mg triamcinolone acetonide 40 MG/ML; 1.5 mL lidocaine 1 % Aspirate: 0 mL Outcome: tolerated well, no immediate complications Procedure, treatment alternatives, risks and benefits explained, specific risks discussed. Consent was given by the patient. Immediately prior to procedure a time out was called to verify the correct patient, procedure, equipment, support staff and site/side marked as required. Patient was prepped and draped in the usual sterile fashion.     Allergies: Codeine, Other, and Amoxicillin   Assessment / Plan:     Visit Diagnoses: Chronic pain of left knee-his pain and his left knee started about 2 months ago after kneeling and sawing a tree.  He was evaluated by Dr. Ave Filter where  he had x-rays of his knee joint and ankle.  According to patient the x-rays were unremarkable.  He has ongoing discomfort in his left knee joint and stiffness.  I have given him a handout on knee exercises.  Have also advised him to use Voltaren gel if his symptoms persist.  A list of natural anti-inflammatories were given and side effects were discussed.  Pain in left shin-he has been having some tenderness over left shin area.  X-rays were unremarkable per patient.  Trigger thumb, left thumb - Injected on December 01, 2018.  Advised topical diclofenac gel.  Patient had good response to the cortisone injection.  Primary osteoarthritis of both hands - Mild.  S/P lumbar spinal fusion - Chronic pain.  He has limited range of motion.  Essential hypertension-his blood pressure is well controlled.  History of hyperlipidemia  Anxiety and depression  Orders: Orders Placed This Encounter  Procedures  . Large Joint Inj   No orders of the defined types were placed in this encounter.   Face-to-face time spent with patient was 30 minutes. Greater than 50% of time was spent in counseling and coordination of care.  Follow-Up Instructions: Return in about 6 months (around 08/01/2019) for Osteoarthritis.   Pollyann Savoy, MD  Note - This record has been created using Animal nutritionist.  Chart creation errors have been sought, but may not always  have been located. Such creation errors do not reflect on  the standard of medical care.

## 2019-02-01 ENCOUNTER — Encounter: Payer: Self-pay | Admitting: Rheumatology

## 2019-02-01 ENCOUNTER — Ambulatory Visit (INDEPENDENT_AMBULATORY_CARE_PROVIDER_SITE_OTHER): Payer: Medicare Other | Admitting: Rheumatology

## 2019-02-01 ENCOUNTER — Other Ambulatory Visit: Payer: Self-pay

## 2019-02-01 VITALS — BP 124/81 | HR 110 | Resp 15 | Ht 71.0 in | Wt 198.6 lb

## 2019-02-01 DIAGNOSIS — Z8639 Personal history of other endocrine, nutritional and metabolic disease: Secondary | ICD-10-CM

## 2019-02-01 DIAGNOSIS — M79662 Pain in left lower leg: Secondary | ICD-10-CM

## 2019-02-01 DIAGNOSIS — I10 Essential (primary) hypertension: Secondary | ICD-10-CM | POA: Diagnosis not present

## 2019-02-01 DIAGNOSIS — M65312 Trigger thumb, left thumb: Secondary | ICD-10-CM

## 2019-02-01 DIAGNOSIS — G8929 Other chronic pain: Secondary | ICD-10-CM

## 2019-02-01 DIAGNOSIS — F329 Major depressive disorder, single episode, unspecified: Secondary | ICD-10-CM

## 2019-02-01 DIAGNOSIS — F419 Anxiety disorder, unspecified: Secondary | ICD-10-CM

## 2019-02-01 DIAGNOSIS — M19041 Primary osteoarthritis, right hand: Secondary | ICD-10-CM | POA: Insufficient documentation

## 2019-02-01 DIAGNOSIS — Z981 Arthrodesis status: Secondary | ICD-10-CM

## 2019-02-01 DIAGNOSIS — M25562 Pain in left knee: Secondary | ICD-10-CM

## 2019-02-01 DIAGNOSIS — M19042 Primary osteoarthritis, left hand: Secondary | ICD-10-CM

## 2019-02-01 MED ORDER — LIDOCAINE HCL 1 % IJ SOLN
1.5000 mL | INTRAMUSCULAR | Status: AC | PRN
  Administered 2019-02-01: 1.5 mL

## 2019-02-01 MED ORDER — TRIAMCINOLONE ACETONIDE 40 MG/ML IJ SUSP
40.0000 mg | INTRAMUSCULAR | Status: AC | PRN
  Administered 2019-02-01: 40 mg via INTRA_ARTICULAR

## 2019-02-01 NOTE — Patient Instructions (Signed)
Journal for Nurse Practitioners, 15(4), 263-267. Retrieved February 09, 2018 from http://[email or web address].com/[email or web address]">  Knee Exercises Ask your health care provider which exercises are safe for you. Do exercises exactly as told by your health care provider and adjust them as directed. It is normal to feel mild stretching, pulling, tightness, or discomfort as you do these exercises. Stop right away if you feel sudden pain or your pain gets worse. Do not begin these exercises until told by your health care provider. Stretching and range-of-motion exercises These exercises warm up your muscles and joints and improve the movement and flexibility of your knee. These exercises also help to relieve pain and swelling. Knee extension, prone 1. Lie on your abdomen (prone position) on a bed. 2. Place your left / right knee just beyond the edge of the surface so your knee is not on the bed. You can put a towel under your left / right thigh just above your kneecap for comfort. 3. Relax your leg muscles and allow gravity to straighten your knee (extension). You should feel a stretch behind your left / right knee. 4. Hold this position for __________ seconds. 5. Scoot up so your knee is supported between repetitions. Repeat __________ times. Complete this exercise __________ times a day. Knee flexion, active  1. Lie on your back with both legs straight. If this causes back discomfort, bend your left / right knee so your foot is flat on the floor. 2. Slowly slide your left / right heel back toward your buttocks. Stop when you feel a gentle stretch in the front of your knee or thigh (flexion). 3. Hold this position for __________ seconds. 4. Slowly slide your left / right heel back to the starting position. Repeat __________ times. Complete this exercise __________ times a day. Quadriceps stretch, prone  1. Lie on your abdomen on a firm surface, such as a bed or padded floor. 2. Bend your left / right knee and hold  your ankle. If you cannot reach your ankle or pant leg, loop a belt around your foot and grab the belt instead. 3. Gently pull your heel toward your buttocks. Your knee should not slide out to the side. You should feel a stretch in the front of your thigh and knee (quadriceps). 4. Hold this position for __________ seconds. Repeat __________ times. Complete this exercise __________ times a day. Hamstring, supine 1. Lie on your back (supine position). 2. Loop a belt or towel over the ball of your left / right foot. The ball of your foot is on the walking surface, right under your toes. 3. Straighten your left / right knee and slowly pull on the belt to raise your leg until you feel a gentle stretch behind your knee (hamstring). ? Do not let your knee bend while you do this. ? Keep your other leg flat on the floor. 4. Hold this position for __________ seconds. Repeat __________ times. Complete this exercise __________ times a day. Strengthening exercises These exercises build strength and endurance in your knee. Endurance is the ability to use your muscles for a long time, even after they get tired. Quadriceps, isometric This exercise stretches the muscles in front of your thigh (quadriceps) without moving your knee joint (isometric). 1. Lie on your back with your left / right leg extended and your other knee bent. Put a rolled towel or small pillow under your knee if told by your health care provider. 2. Slowly tense the muscles in the front of your left /   right thigh. You should see your kneecap slide up toward your hip or see increased dimpling just above the knee. This motion will push the back of the knee toward the floor. 3. For __________ seconds, hold the muscle as tight as you can without increasing your pain. 4. Relax the muscles slowly and completely. Repeat __________ times. Complete this exercise __________ times a day. Straight leg raises This exercise stretches the muscles in front  of your thigh (quadriceps) and the muscles that move your hips (hip flexors). 1. Lie on your back with your left / right leg extended and your other knee bent. 2. Tense the muscles in the front of your left / right thigh. You should see your kneecap slide up or see increased dimpling just above the knee. Your thigh may even shake a bit. 3. Keep these muscles tight as you raise your leg 4-6 inches (10-15 cm) off the floor. Do not let your knee bend. 4. Hold this position for __________ seconds. 5. Keep these muscles tense as you lower your leg. 6. Relax your muscles slowly and completely after each repetition. Repeat __________ times. Complete this exercise __________ times a day. Hamstring, isometric 1. Lie on your back on a firm surface. 2. Bend your left / right knee about __________ degrees. 3. Dig your left / right heel into the surface as if you are trying to pull it toward your buttocks. Tighten the muscles in the back of your thighs (hamstring) to "dig" as hard as you can without increasing any pain. 4. Hold this position for __________ seconds. 5. Release the tension gradually and allow your muscles to relax completely for __________ seconds after each repetition. Repeat __________ times. Complete this exercise __________ times a day. Hamstring curls If told by your health care provider, do this exercise while wearing ankle weights. Begin with __________ lb weights. Then increase the weight by 1 lb (0.5 kg) increments. Do not wear ankle weights that are more than __________ lb. 1. Lie on your abdomen with your legs straight. 2. Bend your left / right knee as far as you can without feeling pain. Keep your hips flat against the floor. 3. Hold this position for __________ seconds. 4. Slowly lower your leg to the starting position. Repeat __________ times. Complete this exercise __________ times a day. Squats This exercise strengthens the muscles in front of your thigh and knee  (quadriceps). 1. Stand in front of a table, with your feet and knees pointing straight ahead. You may rest your hands on the table for balance but not for support. 2. Slowly bend your knees and lower your hips like you are going to sit in a chair. ? Keep your weight over your heels, not over your toes. ? Keep your lower legs upright so they are parallel with the table legs. ? Do not let your hips go lower than your knees. ? Do not bend lower than told by your health care provider. ? If your knee pain increases, do not bend as low. 3. Hold the squat position for __________ seconds. 4. Slowly push with your legs to return to standing. Do not use your hands to pull yourself to standing. Repeat __________ times. Complete this exercise __________ times a day. Wall slides This exercise strengthens the muscles in front of your thigh and knee (quadriceps). 1. Lean your back against a smooth wall or door, and walk your feet out 18-24 inches (46-61 cm) from it. 2. Place your feet hip-width apart. 3.   Slowly slide down the wall or door until your knees bend __________ degrees. Keep your knees over your heels, not over your toes. Keep your knees in line with your hips. 4. Hold this position for __________ seconds. Repeat __________ times. Complete this exercise __________ times a day. Straight leg raises This exercise strengthens the muscles that rotate the leg at the hip and move it away from your body (hip abductors). 1. Lie on your side with your left / right leg in the top position. Lie so your head, shoulder, knee, and hip line up. You may bend your bottom knee to help you keep your balance. 2. Roll your hips slightly forward so your hips are stacked directly over each other and your left / right knee is facing forward. 3. Leading with your heel, lift your top leg 4-6 inches (10-15 cm). You should feel the muscles in your outer hip lifting. ? Do not let your foot drift forward. ? Do not let your knee  roll toward the ceiling. 4. Hold this position for __________ seconds. 5. Slowly return your leg to the starting position. 6. Let your muscles relax completely after each repetition. Repeat __________ times. Complete this exercise __________ times a day. Straight leg raises This exercise stretches the muscles that move your hips away from the front of the pelvis (hip extensors). 1. Lie on your abdomen on a firm surface. You can put a pillow under your hips if that is more comfortable. 2. Tense the muscles in your buttocks and lift your left / right leg about 4-6 inches (10-15 cm). Keep your knee straight as you lift your leg. 3. Hold this position for __________ seconds. 4. Slowly lower your leg to the starting position. 5. Let your leg relax completely after each repetition. Repeat __________ times. Complete this exercise __________ times a day. This information is not intended to replace advice given to you by your health care provider. Make sure you discuss any questions you have with your health care provider. Document Released: 03/06/2005 Document Revised: 02/10/2018 Document Reviewed: 02/10/2018 [employer] Patient Education  2020 [employer]  

## 2019-04-14 ENCOUNTER — Other Ambulatory Visit: Payer: Self-pay

## 2019-04-14 ENCOUNTER — Telehealth: Payer: Self-pay

## 2019-04-14 ENCOUNTER — Ambulatory Visit: Payer: Medicare Other | Admitting: Cardiology

## 2019-04-14 ENCOUNTER — Encounter: Payer: Self-pay | Admitting: Cardiology

## 2019-04-14 VITALS — Temp 97.8°F | Ht 71.0 in | Wt 196.0 lb

## 2019-04-14 NOTE — Progress Notes (Signed)
Patient seen next day due to rescheduling

## 2019-04-15 ENCOUNTER — Encounter: Payer: Self-pay | Admitting: Cardiology

## 2019-04-15 ENCOUNTER — Ambulatory Visit (INDEPENDENT_AMBULATORY_CARE_PROVIDER_SITE_OTHER): Payer: Medicare Other | Admitting: Cardiology

## 2019-04-15 VITALS — BP 128/76 | HR 88 | Temp 98.0°F | Ht 71.0 in | Wt 196.1 lb

## 2019-04-15 DIAGNOSIS — I77810 Thoracic aortic ectasia: Secondary | ICD-10-CM

## 2019-04-15 DIAGNOSIS — R9431 Abnormal electrocardiogram [ECG] [EKG]: Secondary | ICD-10-CM | POA: Diagnosis not present

## 2019-04-15 DIAGNOSIS — I129 Hypertensive chronic kidney disease with stage 1 through stage 4 chronic kidney disease, or unspecified chronic kidney disease: Secondary | ICD-10-CM

## 2019-04-15 DIAGNOSIS — R0609 Other forms of dyspnea: Secondary | ICD-10-CM

## 2019-04-15 DIAGNOSIS — I10 Essential (primary) hypertension: Secondary | ICD-10-CM

## 2019-04-15 DIAGNOSIS — N1832 Chronic kidney disease, stage 3b: Secondary | ICD-10-CM

## 2019-04-15 DIAGNOSIS — R000 Tachycardia, unspecified: Secondary | ICD-10-CM

## 2019-04-15 NOTE — Progress Notes (Signed)
Primary Physician/Referring:  [name], MD  Patient ID: Richard Williams, male    DOB: 01/02/1941, 78 y.o.   MRN: [id number]  No chief complaint on file.  HPI:    Richard Williams  is a 78 y.o. Caucasian male with history of hypertension, Mild adequate dilatation at 3.7 cm in 2007,chronic dyspnea on exertion, GERD, lumbar disc disease, remotely while he was living in [place] told to have atrial fibrillation but never on anticoagulation, previously patient of Dr. Tollie Eth presents to establish care.  About 2-3 weeks ago he has noticed on his "sleep number bed" that his heart rate had been around 80-90 bpm compared to usual around 70s.  No symptoms.  Also states that due to health issues with his wife, he was also under a little bit of her stress.  States that he is back to normal.  Denies dyspnea, chest pain or palpitations.  No dizziness or syncope.  No symptoms to suggest claudication.  He is never a smoker.  Past Medical History:  Diagnosis Date  . Anxiety   . Arthritis   . BPH (benign prostatic hypertrophy)    on meds  . Depression   . Dysrhythmia    1 episode of AF but never came back  . 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 GERD (gastroesophageal reflux disease)    on meds  . Hypertension   . Neuromuscular disorder ([place])     right groin with numbness states he had back surgery in hopes to correct this, but still there  . Peripheral vascular disease ([place])    surface blood clot x1 top of left foot  . Pneumonia    years ago 42,   Past Surgical History:  Procedure Laterality Date  . BACK SURGERY    . EYE SURGERY Bilateral    cataracts  . HERNIA REPAIR    . INGUINAL HERNIA REPAIR Left 11/13/2015   Procedure: OPEN REPAIR LEFT INGUINAL HERNIA;  Surgeon: Excell Seltzer, MD;  Location: [facility];  Service: General;  Laterality: Left;  . LAMINECTOMY WITH POSTERIOR LATERAL ARTHRODESIS LEVEL 2 N/A 03/20/2017   Procedure: Posterior Lateral Fusion - L1 - L3, segmental fixation L1-3;   Surgeon: Eustace Moore, MD;  Location: [facility];  Service: Neurosurgery;  Laterality: N/A;  . LUMBAR LAMINECTOMY/DECOMPRESSION MICRODISCECTOMY N/A 02/10/2014   Procedure: Lumbar One to Two, Lumbar Two to Three Lumbar laminectomy for stenosis/epidural mass;  Surgeon: Eustace Moore, MD;  Location: [facility];  Service: Neurosurgery;  Laterality: N/A;  L1-2 L2-3 Lumbar laminectomy for stenosis/epidural mass  . TONSILLECTOMY     Social History   Socioeconomic History  . Marital status: Married    Spouse name: Not on file  . Number of children: 1  . Years of education: Not on file  . Highest education level: Not on file  Occupational History  . Not on file  Tobacco Use  . Smoking status: Never Smoker  . Smokeless tobacco: Never Used  Substance and Sexual Activity  . Alcohol use: No    Comment: social   . Drug use: No  . Sexual activity: Not on file  Other Topics Concern  . Not on file  Social History Narrative  . Not on file   Social Determinants of Health   Financial Resource Strain:   . Difficulty of Paying Living Expenses: Not on file  Food Insecurity:   . Worried About Charity fundraiser in the Last Year: Not on file  .  Ran Out of Food in the Last Year: Not on file  Transportation Needs:   . Lack of Transportation (Medical): Not on file  . Lack of Transportation (Non-Medical): Not on file  Physical Activity:   . Days of Exercise per Week: Not on file  . Minutes of Exercise per Session: Not on file  Stress:   . Feeling of Stress : Not on file  Social Connections:   . Frequency of Communication with Friends and Family: Not on file  . Frequency of Social Gatherings with Friends and Family: Not on file  . Attends Religious Services: Not on file  . Active Member of Clubs or Organizations: Not on file  . Attends Archivist Meetings: Not on file  . Marital Status: Not on file  Intimate Partner Violence:   . Fear of Current or Ex-Partner: Not on file  .  Emotionally Abused: Not on file  . Physically Abused: Not on file  . Sexually Abused: Not on file   ROS  Review of Systems  Constitution: Negative for chills, decreased appetite, malaise/fatigue and weight gain.  Cardiovascular: Negative for dyspnea on exertion, leg swelling and syncope.  Endocrine: Negative for cold intolerance.  Hematologic/Lymphatic: Does not bruise/bleed easily.  Musculoskeletal: Positive for back pain (scoliosis) and joint pain (left hip and thigh pain). Negative for joint swelling.  Gastrointestinal: Negative for abdominal pain, anorexia, change in bowel habit, hematochezia and melena.  Neurological: Negative for headaches and light-headedness.  Psychiatric/Behavioral: Negative for depression and substance abuse.  All other systems reviewed and are negative.  Objective   Vitals with BMI 04/15/2019 04/14/2019 02/01/2019  Height 5' 11"  5' 11"  5' 11"   Weight 196 lbs 2 oz 196 lbs 198 lbs 10 oz  BMI 27.36 91.66 06.00  Systolic 459 - 977  Diastolic 76 - 81  Pulse 88 - 110      Physical Exam  Constitutional: He appears well-developed and well-nourished.  HENT:  Head: Atraumatic.  Eyes: Conjunctivae are normal.  Neck: No JVD present. No thyromegaly present.  Cardiovascular: Normal rate, regular rhythm, normal heart sounds and intact distal pulses. Exam reveals no gallop.  No murmur heard. No leg edema, no JVD.  Pulmonary/Chest: Effort normal and breath sounds normal.  Abdominal: Soft. Bowel sounds are normal.  Musculoskeletal:        General: Normal range of motion.     Cervical back: Neck supple.  Neurological: He is alert.  Skin: Skin is warm and dry.  Psychiatric: He has a normal mood and affect.   Laboratory examination:    No results for input(s): NA, K, CL, CO2, GLUCOSE, BUN, CREATININE, CALCIUM, GFRNONAA, GFRAA in the last 8760 hours. CrCl cannot be calculated (Patient's most recent lab result is older than the maximum 21 days allowed.).  CMP  Latest Ref Rng & Units 03/11/2017 10/31/2015 02/03/2014  Glucose 65 - 99 mg/dL 125(H) 106(H) 103(H)  BUN 6 - 20 mg/dL 24(H) 27(H) 30(H)  Creatinine 0.61 - 1.24 mg/dL 1.29(H) 1.42(H) 1.19  Sodium 135 - 145 mmol/L 138 136 142  Potassium 3.5 - 5.1 mmol/L 4.0 4.2 4.5  Chloride 101 - 111 mmol/L 106 104 104  CO2 22 - 32 mmol/L 24 27 26   Calcium 8.9 - 10.3 mg/dL 9.2 9.6 9.1   CBC Latest Ref Rng & Units 03/11/2017 10/31/2015 02/03/2014  WBC 4.0 - 10.5 K/uL 9.0 7.7 6.2  Hemoglobin 13.0 - 17.0 g/dL 14.0 14.2 14.3  Hematocrit 39.0 - 52.0 % 40.5 41.8 40.7  Platelets  150 - 400 K/uL 183 180 173   Lipid Panel  No results found for: CHOL, TRIG, HDL, CHOLHDL, VLDL, LDLCALC, LDLDIRECT HEMOGLOBIN A1C No results found for: HGBA1C, MPG TSH No results for input(s): TSH in the last 8760 hours.   Labs 05/20/2018: CBC normal, PSA normal: Total cholesterol 130, triglycerides 79, HDL 49, LDL 65. Serum glucose 86 mg, BUN 20, creatinine 1.48, EGFR 28 mL, potassium 4.1.  CMP otherwise normal.   Lipid profile 03/03/2014: Total cholesterol 162, triglycerides 92, HDL 46, LDL 98.  Medications and allergies   Allergies  Allergen Reactions  . Codeine Hives  . Other Itching and Other (See Comments)    OPIODS--All over itch  Including Laudanum  . Amoxicillin Other (See Comments)    Cold sores  Has patient had a PCN reaction causing immediate rash, facial/tongue/throat swelling, SOB or lightheadedness with hypotension: No Has patient had a PCN reaction causing severe rash involving mucus membranes or skin necrosis: Yes Has patient had a PCN reaction that required hospitalization: No Has patient had a PCN reaction occurring within the last 10 years: No If all of the above answers are "NO", then may proceed with Cephalosporin use.      Current Outpatient Medications  Medication Instructions  . azelastine (ASTELIN) 0.1 % nasal spray 1 spray, Each Nare, 2 times daily, Use in each nostril as directed  . buPROPion  (WELLBUTRIN SR) 200 mg, 2 times daily  . busPIRone (BUSPAR) 10 mg, Oral, 2 tabs in the am, 1 pm  . cetirizine (ZYRTEC) 10 mg, Oral, Daily  . cholecalciferol (VITAMIN D3) 1,000 Units, Oral, Daily  . docusate sodium (DULCOLAX PINK STOOL SOFTENER) 100 MG capsule Daily  . esomeprazole (NEXIUM) 20 mg, Oral, 2 times daily  . ezetimibe (ZETIA) 10 MG tablet Take 10 mg by mouth. M, W, Friday only  . fluticasone (FLONASE) 50 MCG/ACT nasal spray 1 spray, Each Nare, Daily after supper  . hydrochlorothiazide (HYDRODIURIL) 12.5 mg, Oral, Daily  . lisinopril (ZESTRIL) 5 mg, Oral, Daily  . methocarbamol (ROBAXIN) 500 mg, Oral, Every 6 hours PRN  . metroNIDAZOLE (METROGEL) 1 % gel As needed  . montelukast (SINGULAIR) 10 mg, Oral, Daily at bedtime  . terazosin (HYTRIN) 4 mg, Daily    Radiology:  No results found. Cardiac Studies:   Echocardiogram 11/28/2014: Mild LVH, normal LV systolic function, EF 56% with grade 1 diastolic dysfunction.  Mild tricuspid regurgitation, no pulmonary hypertension.  Mild aortic root dilatation measured at 3.7 cm.  Hypermobile interatrial septum without PFO. No change in Ao size since 2007  Treadmill exercise stress test 12/07/2014: Patient exercised for 9 minutes in Bruce stage III, achieving 10 METS workload.  There was no evidence of ischemia.  Normal blood pressure response.  Achieved 114% of MPHR.  Assessment     ICD-10-CM   1. Heart rate fast  R00.0   2. Dyspnea on exertion  R06.00   3. Nonspecific abnormal electrocardiogram (ECG) (EKG)  R94.31   4. Aortic root dilatation (HCC)  I77.810   5. Essential hypertension  I10     EKG 04/14/2019: Normal sinus rhythm at the rate of 91 bpm, left atrial abnormality, left axis deviation, left anterior fascicular block.  Incomplete right bundle branch block.  LVH.  Poor R-wave progression, cannot exclude anterolateral infarct old.  Abnormal EKG.    Recommendations:  No orders of the defined types were placed in this  encounter.   Richard Williams  is a 78 y.o. Caucasian male with history  of hypertension, Mild adequate dilatation at 3.7 cm in 2007,chronic dyspnea on exertion, GERD, lumbar disc disease, remotely while he was living in [place] told to have atrial fibrillation but never on anticoagulation, previously patient of Dr. Tollie Eth presents to establish care.  Unremarkable physical exam, blood pressure is well controlled, EKG unchanged from prior EKG on 05/20/2018 and also previous to that.  His labs are within normal limits including lipids.  He is again scheduled for complete physical examination and labs in January 2021.  He will update this to me.  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  This is been stable.  With regard to aortic root dilatation, echocardiogram done 4 years ago also had revealed no change in aortic root diameter.  I also reviewed his MRA that was done in 2007, very small aortic root enlargement, do not think he needs surveillance of this as it has not changed since 2007.  Abdominal examination does not reveal any prominent aortic pulsation as well.  No changes in the medications were done today.  I will see him back on an annual basis.  Adrian Prows, MD, [facility] 04/15/2019, 12:48 PM [facility] [place] Pager: [phone] Office: [phone] If no answer Cell [phone]

## 2019-07-22 NOTE — Progress Notes (Signed)
Office Visit Note  Patient: Richard Williams             Date of Birth: Sep 07, 1940           MRN: [id number]             PCP: [name], MD Referring: [name], MD Visit Date: 08/02/2019 Occupation: @GUAROCC @  Subjective:  Routine follow up   History of Present Illness: Richard Williams is a 79 y.o. male with history of polyarthralgia.  He denies any left knee joint pain or left shin pain at this time. He denies any joint swelling.  He uses voltaren gel topically twice daily over the left trochanteric bursa which resolves his discomfort.  He has been on Voltaren gel to be very effective.  His left trigger thumb resolved after the cortisone injection on 12/01/19.  He states he did not start taking natural antiinflammatories due to not needing it after using Voltaren gel topically.   Activities of Daily Living:  Patient reports morning stiffness for 0 minutes.   Patient Denies nocturnal pain.  Difficulty dressing/grooming: Denies Difficulty climbing stairs: Denies Difficulty getting out of chair: Denies Difficulty using hands for taps, buttons, cutlery, and/or writing: Denies  Review of Systems  Constitutional: Negative for fatigue and night sweats.  HENT: Positive for mouth dryness. Negative for mouth sores and nose dryness.   Eyes: Negative for redness, itching and dryness.  Respiratory: Negative for cough, hemoptysis, shortness of breath and difficulty breathing.   Cardiovascular: Negative for chest pain, palpitations, hypertension, irregular heartbeat and swelling in legs/feet.  Gastrointestinal: Negative for blood in stool, constipation and diarrhea.  Endocrine: Negative for increased urination.  Genitourinary: Negative for difficulty urinating and painful urination.  Musculoskeletal: Negative for arthralgias, joint pain, joint swelling, myalgias, muscle weakness, morning stiffness, muscle tenderness and myalgias.  Skin: Negative for color change, rash, hair loss,  nodules/bumps, redness, skin tightness, ulcers and sensitivity to sunlight.  Allergic/Immunologic: Negative for susceptible to infections.  Neurological: Positive for memory loss. Negative for dizziness, fainting, numbness, headaches, night sweats and weakness.  Hematological: Negative for bruising/bleeding tendency and swollen glands.  Psychiatric/Behavioral: Negative for depressed mood, confusion and sleep disturbance. The patient is not nervous/anxious.     PMFS History:  Patient Active Problem List   Diagnosis Date Noted  . Primary osteoarthritis of both hands 02/01/2019  . 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 Recurrent left inguinal hernia 11/13/2015  . S/P lumbar laminectomy 02/10/2014    Past Medical History:  Diagnosis Date  . Anxiety   . Arthritis   . BPH (benign prostatic hypertrophy)    on meds  . Depression   . Dysrhythmia    1 episode of AF but never came back  . 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 GERD (gastroesophageal reflux disease)    on meds  . Hypertension   . Neuromuscular disorder ([place])     right groin with numbness states he had back surgery in hopes to correct this, but still there  . Peripheral vascular disease ([name])    surface blood clot x1 top of left foot  . Pneumonia    years ago 66,    Family History  Problem Relation Age of Onset  . Stroke Mother   . Heart Problems Father   . Macular degeneration Sister   . Healthy Daughter    Past Surgical History:  Procedure Laterality Date  . BACK SURGERY    . EYE SURGERY Bilateral    cataracts  .  HERNIA REPAIR    . INGUINAL HERNIA REPAIR Left 11/13/2015   Procedure: OPEN REPAIR LEFT INGUINAL HERNIA;  Surgeon: Glenna Fellows, MD;  Location: [facility];  Service: General;  Laterality: Left;  . LAMINECTOMY WITH POSTERIOR LATERAL ARTHRODESIS LEVEL 2 N/A 03/20/2017   Procedure: Posterior Lateral Fusion - L1 - L3, segmental fixation L1-3;  Surgeon: Tia Alert, MD;  Location: [facility];  Service: Neurosurgery;   Laterality: N/A;  . LUMBAR LAMINECTOMY/DECOMPRESSION MICRODISCECTOMY N/A 02/10/2014   Procedure: Lumbar One to Two, Lumbar Two to Three Lumbar laminectomy for stenosis/epidural mass;  Surgeon: Tia Alert, MD;  Location: [facility];  Service: Neurosurgery;  Laterality: N/A;  L1-2 L2-3 Lumbar laminectomy for stenosis/epidural mass  . TONSILLECTOMY     Social History   Social History Narrative  . Not on file    There is no immunization history on file for this patient.   Objective: Vital Signs: BP 126/79 (BP Location: Left Arm, Patient Position: Sitting, Cuff Size: Normal)   Pulse 91   Resp 16   Ht 5\' 11"  (1.803 m)   Wt 199 lb (90.3 kg)   BMI 27.75 kg/m    Physical Exam Vitals and nursing note reviewed.  Constitutional:      Appearance: He is well-developed.  HENT:     Head: Normocephalic and atraumatic.  Eyes:     Conjunctiva/sclera: Conjunctivae normal.     Pupils: Pupils are equal, round, and reactive to light.  Pulmonary:     Effort: Pulmonary effort is normal.  Abdominal:     General: Bowel sounds are normal.     Palpations: Abdomen is soft.  Musculoskeletal:     Cervical back: Normal range of motion and neck supple.  Skin:    General: Skin is warm and dry.     Capillary Refill: Capillary refill takes less than 2 seconds.  Neurological:     Mental Status: He is alert and oriented to person, place, and time.  Psychiatric:        Behavior: Behavior normal.      Musculoskeletal Exam: C-spine, thoracic spine, and lumbar spine good ROM.  No midline spinal tenderness.  Shoulder joints, elbow joints, wrist joints, MCPs, PIPs, and DIPs good ROM with no synovitis.  Complete fist formation bilaterally.  Hip joints, knee joints, ankle joints, MTPs, PIPs, and DIPs good ROM with no synovitis.  No warmth or effusion of knee joints.  No tenderness or swelling of ankle joints.  No tenderness over trochanteric bursa bilaterally.   CDAI Exam: CDAI Score: -- Patient Global: --;  Provider Global: -- Swollen: --; Tender: -- Joint Exam 08/02/2019   No joint exam has been documented for this visit   There is currently no information documented on the homunculus. Go to the Rheumatology activity and complete the homunculus joint exam.  Investigation: No additional findings.  Imaging: No results found.  Recent Labs: Lab Results  Component Value Date   WBC 9.0 03/11/2017   HGB 14.0 03/11/2017   PLT 183 03/11/2017   NA 138 03/11/2017   K 4.0 03/11/2017   CL 106 03/11/2017   CO2 24 03/11/2017   GLUCOSE 125 (H) 03/11/2017   BUN 24 (H) 03/11/2017   CREATININE 1.29 (H) 03/11/2017   CALCIUM 9.2 03/11/2017   GFRAA >60 03/11/2017    Speciality Comments: No specialty comments available.  Procedures:  No procedures performed Allergies: Codeine, Other, and Amoxicillin   Assessment / Plan:     Visit Diagnoses: Chronic pain  of left knee - X-rays of left knee and left ankle ordered at Dr. Veda Canning office were unremarkable according to the patient.  His left knee joint pain has resolved.  He has good range of motion of the left knee joint on exam.  No warmth or effusion was noted.  He did not start taking natural anti-inflammatories due to his pain improving since using Voltaren gel topically as needed.  He was advised to notify us if his left knee joint pain returns or worsens.  He will follow-up in the office in 1 year.  Pain in left shin: Resolved.  No tenderness on exam.   Trigger thumb, left thumb - Injected on December 01, 2018.  Resolved.   Primary osteoarthritis of both hands - Mild. He has PIP and DIP thickening consistent with osteoarthritis of both hands. No synovitis noted.  Complete fist formation bilaterally. Joint protection and muscle strengthening were discussed.   S/P lumbar spinal fusion: He is not having any discomfort at this time.   Other medical conditions are listed as follows:   Anxiety and depression  History of  hyperlipidemia  Essential hypertension  Orders: No orders of the defined types were placed in this encounter.  No orders of the defined types were placed in this encounter.   Follow-Up Instructions: Return in about 1 year (around 08/01/2020).   Gearldine Bienenstock, PA-C   I examined and evaluated the patient with Sherron Ales PA.  Patient's knee joint and trochanteric pain has improved.  His trigger finger has not been bothersome.  On my examination he had no synovitis today.  The plan of care was discussed as noted above.  Pollyann Savoy, MD  Note - This record has been created using Animal nutritionist.  Chart creation errors have been sought, but may not always  have been located. Such creation errors do not reflect on  the standard of medical care.

## 2019-08-02 ENCOUNTER — Ambulatory Visit (INDEPENDENT_AMBULATORY_CARE_PROVIDER_SITE_OTHER): Payer: Medicare Other | Admitting: Rheumatology

## 2019-08-02 ENCOUNTER — Other Ambulatory Visit: Payer: Self-pay

## 2019-08-02 ENCOUNTER — Encounter: Payer: Self-pay | Admitting: Rheumatology

## 2019-08-02 VITALS — BP 126/79 | HR 91 | Resp 16 | Ht 71.0 in | Wt 199.0 lb

## 2019-08-02 DIAGNOSIS — M65312 Trigger thumb, left thumb: Secondary | ICD-10-CM

## 2019-08-02 DIAGNOSIS — M19042 Primary osteoarthritis, left hand: Secondary | ICD-10-CM

## 2019-08-02 DIAGNOSIS — Z981 Arthrodesis status: Secondary | ICD-10-CM

## 2019-08-02 DIAGNOSIS — F419 Anxiety disorder, unspecified: Secondary | ICD-10-CM

## 2019-08-02 DIAGNOSIS — G8929 Other chronic pain: Secondary | ICD-10-CM | POA: Diagnosis not present

## 2019-08-02 DIAGNOSIS — I10 Essential (primary) hypertension: Secondary | ICD-10-CM

## 2019-08-02 DIAGNOSIS — F329 Major depressive disorder, single episode, unspecified: Secondary | ICD-10-CM

## 2019-08-02 DIAGNOSIS — M19041 Primary osteoarthritis, right hand: Secondary | ICD-10-CM | POA: Diagnosis not present

## 2019-08-02 DIAGNOSIS — M25562 Pain in left knee: Secondary | ICD-10-CM | POA: Diagnosis not present

## 2019-08-02 DIAGNOSIS — M79662 Pain in left lower leg: Secondary | ICD-10-CM | POA: Diagnosis not present

## 2019-08-02 DIAGNOSIS — Z8639 Personal history of other endocrine, nutritional and metabolic disease: Secondary | ICD-10-CM

## 2019-12-16 ENCOUNTER — Telehealth: Payer: Self-pay | Admitting: Podiatry

## 2019-12-16 NOTE — Telephone Encounter (Signed)
Pt left message @ 257pm yesterday asking for an appt to see a podiatrist.  I returned call and it went to voicemail and I left message for pt to call to schedule an appt.

## 2019-12-21 ENCOUNTER — Ambulatory Visit (INDEPENDENT_AMBULATORY_CARE_PROVIDER_SITE_OTHER): Payer: Medicare Other | Admitting: Podiatry

## 2019-12-21 ENCOUNTER — Ambulatory Visit (INDEPENDENT_AMBULATORY_CARE_PROVIDER_SITE_OTHER): Payer: Medicare Other

## 2019-12-21 ENCOUNTER — Other Ambulatory Visit: Payer: Self-pay | Admitting: Podiatry

## 2019-12-21 ENCOUNTER — Other Ambulatory Visit: Payer: Self-pay

## 2019-12-21 ENCOUNTER — Encounter: Payer: Self-pay | Admitting: Podiatry

## 2019-12-21 VITALS — BP 117/75 | HR 109 | Temp 97.3°F | Resp 16

## 2019-12-21 DIAGNOSIS — M2141 Flat foot [pes planus] (acquired), right foot: Secondary | ICD-10-CM

## 2019-12-21 DIAGNOSIS — M79671 Pain in right foot: Secondary | ICD-10-CM

## 2019-12-21 DIAGNOSIS — M21861 Other specified acquired deformities of right lower leg: Secondary | ICD-10-CM

## 2019-12-21 DIAGNOSIS — M21862 Other specified acquired deformities of left lower leg: Secondary | ICD-10-CM

## 2019-12-21 DIAGNOSIS — M216X2 Other acquired deformities of left foot: Secondary | ICD-10-CM | POA: Diagnosis not present

## 2019-12-21 DIAGNOSIS — M2142 Flat foot [pes planus] (acquired), left foot: Secondary | ICD-10-CM

## 2019-12-21 DIAGNOSIS — M79672 Pain in left foot: Secondary | ICD-10-CM | POA: Diagnosis not present

## 2019-12-21 DIAGNOSIS — M216X1 Other acquired deformities of right foot: Secondary | ICD-10-CM

## 2019-12-22 NOTE — Progress Notes (Signed)
  Subjective:  Patient ID: Richard Williams, male    DOB: 15-Oct-1940,  MRN: [id number]  Chief Complaint  Patient presents with  . Foot Issue    Bilateral; pt stated, "Wants both feet checked; am I going flat-footed? I have Scoliosis; I am leaning more to the left; looks like my Right foot is turning more to the left"; x2-3 months    79 y.o. male presents with the above complaint. History confirmed with patient.  Complains of arch pain on the inside of both arches.  The right side is worse than the left.  Objective:  Physical Exam: warm, good capillary refill, no trophic changes or ulcerative lesions, normal DP and PT pulses and normal sensory exam.  Bilaterally he has moderate collapsing pes planovalgus.  Ankle joint dorsiflexion is reduced at 5 degrees with a positive Silfverskiold test bilaterally.  Subtalar joint range of motion is maintained  Radiographs: X-ray of both feet: plantar calcaneal spur, pes planus, increased talar declination and forefoot abduction Assessment:   1. Right foot pain   2. Left foot pain   3. Pes planus of both feet      Plan:  Patient was evaluated and treated and all questions answered.   -Discussed the etiology of pes planus and his painful arch and how this relates to equinus deformity. -Discussed surgical and nonsurgical treatment options including injection therapy, physical therapy, orthotics, and surgical correction of his deformity. -He is interested in custom orthotics, but he would like to try some prefabricated ones first to see if he likes them.  I think his foot shape would be amenable to using prefabricated orthotics and these were dispensed today with power steps. -Currently he is only mildly tender over the sinus tarsi and prefer to avoid an injection today. -We also discussed physical therapy, he would like to wait on this as well to see if the orthotics help his symptoms, his wife just had spine surgery and he has been taking care of her  and is unable to go to physical therapy right now.  Return in about 3 months (around 03/22/2020).

## 2020-03-23 ENCOUNTER — Ambulatory Visit: Payer: Self-pay | Admitting: Podiatry

## 2020-04-10 ENCOUNTER — Ambulatory Visit: Payer: Medicare Other | Admitting: Cardiology

## 2020-05-15 ENCOUNTER — Ambulatory Visit: Payer: Medicare Other | Admitting: Cardiology

## 2020-06-14 ENCOUNTER — Encounter: Payer: Self-pay | Admitting: Cardiology

## 2020-06-14 ENCOUNTER — Other Ambulatory Visit: Payer: Self-pay

## 2020-06-14 ENCOUNTER — Ambulatory Visit: Payer: Medicare Other | Admitting: Cardiology

## 2020-06-14 VITALS — BP 130/74 | HR 105 | Temp 97.9°F | Resp 17 | Ht 71.0 in | Wt 206.0 lb

## 2020-06-14 DIAGNOSIS — R000 Tachycardia, unspecified: Secondary | ICD-10-CM

## 2020-06-14 DIAGNOSIS — I10 Essential (primary) hypertension: Secondary | ICD-10-CM

## 2020-06-14 DIAGNOSIS — R9431 Abnormal electrocardiogram [ECG] [EKG]: Secondary | ICD-10-CM

## 2020-06-14 MED ORDER — DILTIAZEM HCL ER COATED BEADS 180 MG PO CP24: 180.0000 mg | ORAL_CAPSULE | Freq: Every day | ORAL | 2 refills | Status: DC

## 2020-06-14 NOTE — Progress Notes (Signed)
Primary Physician/Referring:  [name], MD  Patient ID: Richard Williams, male    DOB: 01-16-1941, 80 y.o.   MRN: [id number]  Chief Complaint  Patient presents with  . Hypertension  . Abnormal ECG    1 year   HPI:    Richard Williams  is a 80 y.o. 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 Remotely while he was living in [place] told to have atrial fibrillation.   Patient presents for annual follow up of hypertension and elevated heart rate that is chronic. At last visit aortic root dilation had been stable since 2007, therefore continued surveillance was not recommended. Patient is presently doing well. Denies chest pain, palpitations, dizziness, leg swelling, orthopnea, PND.  He denies symptoms of claudication.  Except for chronic back pain no other specific symptoms today but notices that his heart rate is elevated.     Past Medical History:  Diagnosis Date  . Anxiety   . Arthritis   . BPH (benign prostatic hypertrophy)    on meds  . Depression   . Dysrhythmia    1 episode of AF but never came back  . Enlarged aorta ([facility])   . GERD (gastroesophageal reflux disease)    on meds  . Hypertension   . Neuromuscular disorder ([facility])     right groin with numbness states he had back surgery in hopes to correct this, but still there  . Peripheral vascular disease ([facility])    surface blood clot x1 top of left foot  . Pneumonia    years ago 58,   Past Surgical History:  Procedure Laterality Date  . BACK SURGERY    . EYE SURGERY Bilateral    cataracts  . HERNIA REPAIR    . INGUINAL HERNIA REPAIR Left 11/13/2015   Procedure: OPEN REPAIR LEFT INGUINAL HERNIA;  Surgeon: Excell Seltzer, MD;  Location: [facility];  Service: General;  Laterality: Left;  . LAMINECTOMY WITH POSTERIOR LATERAL ARTHRODESIS LEVEL 2 N/A 03/20/2017   Procedure: Posterior Lateral Fusion - L1 - L3, segmental fixation  L1-3;  Surgeon: Eustace Moore, MD;  Location: [facility];  Service: Neurosurgery;  Laterality: N/A;  . LUMBAR LAMINECTOMY/DECOMPRESSION MICRODISCECTOMY N/A 02/10/2014   Procedure: Lumbar One to Two, Lumbar Two to Three Lumbar laminectomy for stenosis/epidural mass;  Surgeon: Eustace Moore, MD;  Location: [facility];  Service: Neurosurgery;  Laterality: N/A;  L1-2 L2-3 Lumbar laminectomy for stenosis/epidural mass  . TONSILLECTOMY     Social History   Tobacco Use  . Smoking status: Never Smoker  . Smokeless tobacco: Never Used  Substance Use Topics  . Alcohol use: Yes    Comment: social    Marital Status: Married  ROS  Review of Systems  Cardiovascular: Negative for chest pain, dyspnea on exertion and leg swelling.  Musculoskeletal: Positive for back pain (scoliosis) and joint pain (degenerative arthritis).  Gastrointestinal: Negative for melena.   Objective   Vitals with BMI 06/14/2020 12/21/2019 08/02/2019  Height _0  - _1   Weight 206 lbs - 199 lbs  BMI 82.70 - 78.67  Systolic 544 920 100  Diastolic 74 75 79  Pulse 712 109 91      Physical Exam Constitutional:      Appearance: He is well-developed.  HENT:     Head: Atraumatic.  Eyes:     Conjunctiva/sclera: Conjunctivae normal.  Neck:     Thyroid:  No thyromegaly.     Vascular: No JVD.  Cardiovascular:     Rate and Rhythm: Regular rhythm. Tachycardia present.     Pulses: Intact distal pulses.     Heart sounds: Normal heart sounds. No murmur heard. No gallop.      Comments: No leg edema, no JVD. Pulmonary:     Effort: Pulmonary effort is normal.     Breath sounds: Normal breath sounds.  Abdominal:     General: Bowel sounds are normal.     Palpations: Abdomen is soft.  Musculoskeletal:        General: Normal range of motion.     Cervical back: Neck supple.  Skin:    General: Skin is warm and dry.  Neurological:     Mental Status: He is alert.    Laboratory examination:    No results for input(s): NA,  K, CL, CO2, GLUCOSE, BUN, CREATININE, CALCIUM, GFRNONAA, GFRAA in the last 8760 hours. CrCl cannot be calculated (Patient's most recent lab result is older than the maximum 21 days allowed.).  CMP Latest Ref Rng & Units 03/11/2017 10/31/2015 02/03/2014  Glucose 65 - 99 mg/dL 125(H) 106(H) 103(H)  BUN 6 - 20 mg/dL 24(H) 27(H) 30(H)  Creatinine 0.61 - 1.24 mg/dL 1.29(H) 1.42(H) 1.19  Sodium 135 - 145 mmol/L 138 136 142  Potassium 3.5 - 5.1 mmol/L 4.0 4.2 4.5  Chloride 101 - 111 mmol/L 106 104 104  CO2 22 - 32 mmol/L _0 Calcium 8.9 - 10.3 mg/dL 9.2 9.6 9.1   CBC Latest Ref Rng & Units 03/11/2017 10/31/2015 02/03/2014  WBC 4.0 - 10.5 K/uL 9.0 7.7 6.2  Hemoglobin 13.0 - 17.0 g/dL 14.0 14.2 14.3  Hematocrit 39.0 - 52.0 % 40.5 41.8 40.7  Platelets 150 - 400 K/uL 183 180 173   Lipid Panel  No results found for: CHOL, TRIG, HDL, CHOLHDL, VLDL, LDLCALC, LDLDIRECT HEMOGLOBIN A1C No results found for: HGBA1C, MPG TSH No results for input(s): TSH in the last 8760 hours.   External labs:    05/26/2019: Total cholesterol 134, triglycerides 86, HDL 43, LDL 74, non-HDL 91 Hemoglobin 14.2, hematocrit 40.4, MCV 94.6, platelets 212 9 sodium 134, potassium 4.2, glucose 89, BUN 16, creatinine 1.12, GFR 63 A1c 5.3% TSH 1.58  05/20/2018: CBC normal, PSA normal: Total cholesterol 130, triglycerides 79, HDL 49, LDL 65. Serum glucose 86 mg, BUN 20, creatinine 1.48, EGFR 28 mL, potassium 4.1.  CMP otherwise normal.   03/03/2014: Total cholesterol 162, triglycerides 92, HDL 46, LDL 98.  Medications and allergies   Allergies  Allergen Reactions  . Codeine Hives  . Other Itching and Other (See Comments)    OPIODS--All over itch  Including Laudanum  . Lorazepam     Pt stated, "I had a side effect -- I could not control my urination"  . Amoxicillin Other (See Comments)    Cold sores  Has patient had a PCN reaction causing immediate rash, facial/tongue/throat swelling, SOB or lightheadedness with  hypotension: No Has patient had a PCN reaction causing severe rash involving mucus membranes or skin necrosis: Yes Has patient had a PCN reaction that required hospitalization: No Has patient had a PCN reaction occurring within the last 10 years: No If all of the above answers are "NO", then may proceed with Cephalosporin use.     Current Outpatient Medications on File Prior to Visit  Medication Sig Dispense Refill  . azelastine (ASTELIN) 0.1 % nasal spray Place 1 spray into both nostrils  2 (two) times daily. Use in each nostril as directed    . buPROPion (WELLBUTRIN SR) 200 MG 12 hr tablet Take 200 mg by mouth 2 (two) times daily.    . Cholecalciferol (VITAMIN D3 PO) Take 1,000 Units by mouth daily.    Marland Kitchen docusate sodium (COLACE) 100 MG capsule daily.     Marland Kitchen esomeprazole (NEXIUM) 40 MG capsule Take 40 mg by mouth 2 (two) times daily.     Marland Kitchen ezetimibe (ZETIA) 10 MG tablet Take 10 mg by mouth. M, W, Friday only    . fluticasone (FLONASE) 50 MCG/ACT nasal spray Place 1 spray into both nostrils daily after supper.    . hydrochlorothiazide (HYDRODIURIL) 25 MG tablet Take 12.5 mg by mouth daily.    . methocarbamol (ROBAXIN) 500 MG tablet Take 1 tablet (500 mg total) by mouth every 6 (six) hours as needed for muscle spasms. 30 tablet 1  . metroNIDAZOLE (METROCREAM) 0.75 % cream Apply topically daily.    . NON FORMULARY ALTERIL Sleep Aid    . senna (SENOKOT) 8.6 MG TABS tablet Take 2 tablets by mouth daily.    Marland Kitchen terazosin (HYTRIN) 2 MG capsule Take 4 mg by mouth daily.     No current facility-administered medications on file prior to visit.     Radiology:  No results found. Cardiac Studies:   Echocardiogram 11/28/2014: Mild LVH, normal LV systolic function, EF 20% with grade 1 diastolic dysfunction.  Mild tricuspid regurgitation, no pulmonary hypertension.  Mild aortic root dilatation measured at 3.7 cm.  Hypermobile interatrial septum without PFO. No change in Ao size since 2007   Treadmill  exercise stress test 12/07/2014: Patient exercised for 9 minutes in Bruce stage III, achieving 10 METS workload.  There was no evidence of ischemia.  Normal blood pressure response.  Achieved 114% of MPHR.  EKG     EKG 06/06/2020: Sinus tachycardia at rate of 100 bpm, fetal enlargement, left axis deviation, left anterior fascicular block.  Incomplete right bundle branch block.  Poor R wave progression, cannot exclude anteroseptal infarct old.  IVCD and also voltage criteria for LVH. No significant change from  EKG 04/14/2019: Normal sinus rhythm at the rate of 91 bpm.   Assessment     ICD-10-CM   1. Essential hypertension  I10 EKG 12-Lead    diltiazem (CARDIZEM CD) 180 MG 24 hr capsule    PCV ECHOCARDIOGRAM COMPLETE  2. Sinus tachycardia  R00.0 PCV ECHOCARDIOGRAM COMPLETE  3. Nonspecific abnormal electrocardiogram (ECG) (EKG)  R94.31       Meds ordered this encounter  Medications  . diltiazem (CARDIZEM CD) 180 MG 24 hr capsule    Sig: Take 1 capsule (180 mg total) by mouth daily.    Dispense:  30 capsule    Refill:  2   Medications Discontinued During This Encounter  Medication Reason  . lisinopril (ZESTRIL) 5 MG tablet Change in therapy    Recommendations:   NAVY BELAY  is a 80 y.o. 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 Remotely while he was living in [place] told to have atrial fibrillation.   Patient presents for annual follow up of hypertension and elevated heart rate that is chronic. At last visit aortic root dilation had been stable since 2007, therefore continued surveillance was not recommended.  Blood pressure remains well controlled. EKG is unchanged from prior although heart rate has been elevated and is  today in sinus tachycardia.  I will switch from lisinopril to diltiazem CD 180 mg daily, I like to obtain echocardiogram to evaluate LV systolic function to  exclude any other structural abnormality in view of underlying sinus tachycardia.  He has complete labs and annual physical scheduled in 1 month with his PCP and will obtain these.  He will also need TSH in view of underlying sinus tachycardia.  I would like to see him back in 2 months for follow-up of hypertension, elevated heart rate and follow-up on the echocardiogram.  If normal, I will see him back on a as needed basis.   Adrian Prows, MD, [facility] 06/14/2020, 4:52 PM Office: [phone] Pager: [phone]

## 2020-07-18 NOTE — Progress Notes (Signed)
Office Visit Note  Patient: Richard Williams             Date of Birth: 1940/09/10           MRN: [id number]             PCP: Burton Apley, MD Referring: Burton Apley, MD Visit Date: 08/01/2020 Occupation: @GUAROCC @  Subjective:  Pain in multiple joints and lower back pain   History of Present Illness: Richard Williams is a 80 y.o. male with history of osteoarthritis and degenerative disc disease.  He states he has had lumbar spine surgery in the past x2.  He recently was cleaning his shower and tweaked his back.  He states he has sudden sharp pain in his lower back.  He has been having lower back discomfort since then.  He denies any radiculopathy.  He states he is unable to take most of the over-the-counter medications.  He would like a referral to pain management.  His left trigger thumb resolved after the injection.  His knee is also doing much better after the injection.  He has been having some discomfort in his toes from underlying osteoarthritis.  He denies any joint swelling.  Activities of Daily Living:  Patient reports morning stiffness for 0 minutes.   Patient Denies nocturnal pain.  Difficulty dressing/grooming: Denies Difficulty climbing stairs: Denies Difficulty getting out of chair: Denies Difficulty using hands for taps, buttons, cutlery, and/or writing: Reports  Review of Systems  Constitutional: Positive for fatigue.  HENT: Positive for mouth dryness. Negative for mouth sores and nose dryness.   Eyes: Negative for pain, itching and dryness.  Respiratory: Negative for shortness of breath and difficulty breathing.   Cardiovascular: Negative for chest pain and palpitations.  Gastrointestinal: Negative for blood in stool, constipation and diarrhea.  Endocrine: Negative for increased urination.  Genitourinary: Negative for difficulty urinating.  Musculoskeletal: Positive for arthralgias, joint pain and morning stiffness. Negative for joint swelling, myalgias, muscle  tenderness and myalgias.  Skin: Negative for color change, rash and redness.  Allergic/Immunologic: Negative for susceptible to infections.  Neurological: Positive for headaches. Negative for dizziness, memory loss and weakness.  Hematological: Negative for bruising/bleeding tendency.  Psychiatric/Behavioral: The patient is not nervous/anxious.     PMFS History:  Patient Active Problem List   Diagnosis Date Noted  . Primary osteoarthritis of both hands 02/01/2019  . 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 Recurrent left inguinal hernia 11/13/2015  . S/P lumbar laminectomy 02/10/2014    Past Medical History:  Diagnosis Date  . Anxiety   . Arthritis   . BPH (benign prostatic hypertrophy)    on meds  . Depression   . Dysrhythmia    1 episode of AF but never came back  . Enlarged aorta ([facility])   . GERD (gastroesophageal reflux disease)    on meds  . Hypertension   . Neuromuscular disorder ([facility])     right groin with numbness states he had back surgery in hopes to correct this, but still there  . Peripheral vascular disease ([facility])    surface blood clot x1 top of left foot  . Pneumonia    years ago 5,    Family History  Problem Relation Age of Onset  . Stroke Mother   . Heart Problems Father   . Macular degeneration Sister   . Healthy Daughter    Past Surgical History:  Procedure Laterality Date  . BACK SURGERY    . EYE SURGERY Bilateral  cataracts  . HERNIA REPAIR    . INGUINAL HERNIA REPAIR Left 11/13/2015   Procedure: OPEN REPAIR LEFT INGUINAL HERNIA;  Surgeon: Glenna Fellows, MD;  Location: [facility];  Service: General;  Laterality: Left;  . LAMINECTOMY WITH POSTERIOR LATERAL ARTHRODESIS LEVEL 2 N/A 03/20/2017   Procedure: Posterior Lateral Fusion - L1 - L3, segmental fixation L1-3;  Surgeon: Tia Alert, MD;  Location: [facility];  Service: Neurosurgery;  Laterality: N/A;  . LUMBAR LAMINECTOMY/DECOMPRESSION MICRODISCECTOMY N/A 02/10/2014   Procedure: Lumbar One to Two,  Lumbar Two to Three Lumbar laminectomy for stenosis/epidural mass;  Surgeon: Tia Alert, MD;  Location: [facility];  Service: Neurosurgery;  Laterality: N/A;  L1-2 L2-3 Lumbar laminectomy for stenosis/epidural mass  . TONSILLECTOMY     Social History   Social History Narrative  . Not on file   Immunization History  Administered Date(s) Administered  . Influenza, High Dose Seasonal PF 02/05/2019  . Moderna Sars-Covid-2 Vaccination 05/18/2019, 06/28/2019, 02/28/2020  . Zoster Recombinat (Shingrix) 07/16/2017     Objective: Vital Signs: BP 128/80 (BP Location: Left Arm, Patient Position: Sitting, Cuff Size: Normal)   Pulse (!) 102   Resp 15   Ht 5\' 11"  (1.803 m)   Wt 204 lb 9.6 oz (92.8 kg)   BMI 28.54 kg/m    Physical Exam Vitals and nursing note reviewed.  Constitutional:      Appearance: He is well-developed.  HENT:     Head: Normocephalic and atraumatic.  Eyes:     Conjunctiva/sclera: Conjunctivae normal.     Pupils: Pupils are equal, round, and reactive to light.  Cardiovascular:     Rate and Rhythm: Normal rate and regular rhythm.     Heart sounds: Normal heart sounds.  Pulmonary:     Effort: Pulmonary effort is normal.     Breath sounds: Normal breath sounds.  Abdominal:     General: Bowel sounds are normal.     Palpations: Abdomen is soft.  Musculoskeletal:     Cervical back: Normal range of motion and neck supple.  Skin:    General: Skin is warm and dry.     Capillary Refill: Capillary refill takes less than 2 seconds.  Neurological:     Mental Status: He is alert and oriented to person, place, and time.  Psychiatric:        Behavior: Behavior normal.      Musculoskeletal Exam: He had limited range of motion of cervical thoracic lumbar spine.  He had thoracic kyphosis.  Shoulder joints, elbow joints, wrist joints were in good range of motion.  He had bilateral PIP and DIP thickening with no synovitis.  Hip joints, knee joints were in good range of  motion.  He no tenderness over ankles or MTPs.  CDAI Exam: CDAI Score: - Patient Global: -; Provider Global: - Swollen: -; Tender: - Joint Exam 08/01/2020   No joint exam has been documented for this visit   There is currently no information documented on the homunculus. Go to the Rheumatology activity and complete the homunculus joint exam.  Investigation: No additional findings.  Imaging: No results found.  Recent Labs: Lab Results  Component Value Date   WBC 9.0 03/11/2017   HGB 14.0 03/11/2017   PLT 183 03/11/2017   NA 138 03/11/2017   K 4.0 03/11/2017   CL 106 03/11/2017   CO2 24 03/11/2017   GLUCOSE 125 (H) 03/11/2017   BUN 24 (H) 03/11/2017   CREATININE 1.29 (H) 03/11/2017  CALCIUM 9.2 03/11/2017   GFRAA >60 03/11/2017    Speciality Comments: No specialty comments available.  Procedures:  No procedures performed Allergies: Codeine, Other, Lorazepam, and Amoxicillin   Assessment / Plan:     Visit Diagnoses: Primary osteoarthritis of both hands -DIP and PIP thickening was noted without any synovitis.  Hand muscle strength exercises and joint protection was discussed.  Trigger thumb, left thumb - Injected on December 01, 2018.  Resolved.   Chronic pain of left knee - X-rays of left knee and left ankle ordered at Dr. Veda Canning office were unremarkable according to the patient.  Left knee joint discomfort improved after the cortisone injection per patient.  Primary osteoarthritis of both feet-he has been having some discomfort in his feet.  No synovitis was noted.  Proper fitting shoes were discussed.  S/P lumbar spinal fusion-history of lumbar spine fusion x2.  He recently had back injury and has been experiencing increased pain.  Per his request will refer him to Dr. Thyra Breed for pain management.  A handout on back exercises was given.  Anxiety and depression-he is on Wellbutrin.  Essential hypertension-blood pressure is well controlled.  History of  hyperlipidemia  Osteoporosis screening-age 65.  Use of calcium, vitamin D, resistive exercises was discussed.  Orders: No orders of the defined types were placed in this encounter.  No orders of the defined types were placed in this encounter.     Follow-Up Instructions: Return in about 6 months (around 02/01/2021) for Osteoarthritis.   Pollyann Savoy, MD  Note - This record has been created using Animal nutritionist.  Chart creation errors have been sought, but may not always  have been located. Such creation errors do not reflect on  the standard of medical care.

## 2020-08-01 ENCOUNTER — Other Ambulatory Visit: Payer: Self-pay

## 2020-08-01 ENCOUNTER — Ambulatory Visit: Payer: Medicare Other | Admitting: Rheumatology

## 2020-08-01 ENCOUNTER — Encounter: Payer: Self-pay | Admitting: Rheumatology

## 2020-08-01 VITALS — BP 128/80 | HR 102 | Resp 15 | Ht 71.0 in | Wt 204.6 lb

## 2020-08-01 DIAGNOSIS — M19041 Primary osteoarthritis, right hand: Secondary | ICD-10-CM | POA: Diagnosis not present

## 2020-08-01 DIAGNOSIS — G8929 Other chronic pain: Secondary | ICD-10-CM

## 2020-08-01 DIAGNOSIS — F419 Anxiety disorder, unspecified: Secondary | ICD-10-CM

## 2020-08-01 DIAGNOSIS — M25562 Pain in left knee: Secondary | ICD-10-CM | POA: Diagnosis not present

## 2020-08-01 DIAGNOSIS — M19072 Primary osteoarthritis, left ankle and foot: Secondary | ICD-10-CM

## 2020-08-01 DIAGNOSIS — I10 Essential (primary) hypertension: Secondary | ICD-10-CM

## 2020-08-01 DIAGNOSIS — F32A Depression, unspecified: Secondary | ICD-10-CM

## 2020-08-01 DIAGNOSIS — M65312 Trigger thumb, left thumb: Secondary | ICD-10-CM | POA: Diagnosis not present

## 2020-08-01 DIAGNOSIS — Z981 Arthrodesis status: Secondary | ICD-10-CM

## 2020-08-01 DIAGNOSIS — M79662 Pain in left lower leg: Secondary | ICD-10-CM

## 2020-08-01 DIAGNOSIS — M19071 Primary osteoarthritis, right ankle and foot: Secondary | ICD-10-CM

## 2020-08-01 DIAGNOSIS — Z8639 Personal history of other endocrine, nutritional and metabolic disease: Secondary | ICD-10-CM

## 2020-08-01 DIAGNOSIS — Z13820 Encounter for screening for osteoporosis: Secondary | ICD-10-CM

## 2020-08-01 DIAGNOSIS — M19042 Primary osteoarthritis, left hand: Secondary | ICD-10-CM

## 2020-08-01 NOTE — Patient Instructions (Addendum)

## 2020-08-01 NOTE — Addendum Note (Signed)
Addended by: Ellen Henri on: 08/01/2020 03:57 PM   Modules accepted: Orders

## 2020-08-03 ENCOUNTER — Ambulatory Visit: Payer: Medicare Other

## 2020-08-03 DIAGNOSIS — I10 Essential (primary) hypertension: Secondary | ICD-10-CM

## 2020-08-03 DIAGNOSIS — R000 Tachycardia, unspecified: Secondary | ICD-10-CM

## 2020-08-07 ENCOUNTER — Other Ambulatory Visit: Payer: Self-pay

## 2020-08-07 ENCOUNTER — Encounter: Payer: Self-pay | Admitting: Cardiology

## 2020-08-07 ENCOUNTER — Ambulatory Visit: Payer: Medicare Other | Admitting: Cardiology

## 2020-08-07 VITALS — BP 135/78 | HR 87 | Temp 98.6°F | Resp 16 | Ht 71.0 in | Wt 198.2 lb

## 2020-08-07 DIAGNOSIS — R000 Tachycardia, unspecified: Secondary | ICD-10-CM

## 2020-08-07 DIAGNOSIS — I77810 Thoracic aortic ectasia: Secondary | ICD-10-CM

## 2020-08-07 DIAGNOSIS — I10 Essential (primary) hypertension: Secondary | ICD-10-CM

## 2020-08-07 MED ORDER — LISINOPRIL 5 MG PO TABS: 5.0000 mg | ORAL_TABLET | Freq: Every day | ORAL | 3 refills | Status: DC

## 2020-08-07 MED ORDER — DILTIAZEM HCL ER COATED BEADS 180 MG PO CP24
180.0000 mg | ORAL_CAPSULE | Freq: Every day | ORAL | 3 refills | Status: DC
Start: 2020-08-07 — End: 2020-09-12

## 2020-08-07 NOTE — Progress Notes (Signed)
Primary Physician/Referring:  [name], MD  Patient ID: Richard Williams, male    DOB: 03-Apr-1941, 80 y.o.   MRN: [id number]  Chief Complaint  Patient presents with  . Hypertension  . Follow-up    2 months   HPI:    Richard Williams  is a 80 y.o. Caucasian male with history of hypertension, mild aortic root dilatation, chronic dyspnea on exertion, GERD, lumbar disc disease. Patient presents for annual follow up of hypertension and elevated heart rate that is chronic.   Since last office visit after he discontinue lisinopril and switch him to diltiazem CD, states that he is feeling the best he has in quite a while with significant improvement in heart rate, also states that it makes him feel so much relaxed.  Denies chest pain, palpitations, dizziness, leg swelling, orthopnea, PND.  He denies symptoms of claudication.  Except for chronic back pain no other specific symptoms today.     Past Medical History:  Diagnosis Date  . Anxiety   . Arthritis   . BPH (benign prostatic hypertrophy)    on meds  . Depression   . Dysrhythmia    1 episode of AF but never came back  . Enlarged aorta ([place])   . GERD (gastroesophageal reflux disease)    on meds  . Hyperlipidemia   . Hypertension   . Neuromuscular disorder ([place])     right groin with numbness states he had back surgery in hopes to correct this, but still there  . Peripheral vascular disease ([place])    surface blood clot x1 top of left foot  . Pneumonia    years ago 79,   Past Surgical History:  Procedure Laterality Date  . BACK SURGERY    . EYE SURGERY Bilateral    cataracts  . HERNIA REPAIR    . INGUINAL HERNIA REPAIR Left 11/13/2015   Procedure: OPEN REPAIR LEFT INGUINAL HERNIA;  Surgeon: Excell Seltzer, MD;  Location: [place];  Service: General;  Laterality: Left;  . LAMINECTOMY WITH POSTERIOR LATERAL ARTHRODESIS LEVEL 2 N/A 03/20/2017   Procedure: Posterior Lateral Fusion - L1 - L3, segmental fixation L1-3;  Surgeon:  Eustace Moore, MD;  Location: [place] [facility];  Service: Neurosurgery;  Laterality: N/A;  . LUMBAR LAMINECTOMY/DECOMPRESSION MICRODISCECTOMY N/A 02/10/2014   Procedure: Lumbar One to Two, Lumbar Two to Three Lumbar laminectomy for stenosis/epidural mass;  Surgeon: Eustace Moore, MD;  Location: [place] [facility];  Service: Neurosurgery;  Laterality: N/A;  L1-2 L2-3 Lumbar laminectomy for stenosis/epidural mass  . TONSILLECTOMY     Social History   Tobacco Use  . Smoking status: Never Smoker  . Smokeless tobacco: Never Used  Substance Use Topics  . Alcohol use: Yes    Comment: once every 6 months    Marital Status: Married  ROS  Review of Systems  Cardiovascular: Negative for chest pain, dyspnea on exertion and leg swelling.  Musculoskeletal: Positive for back pain (scoliosis) and joint pain (degenerative arthritis).  Gastrointestinal: Negative for melena.   Objective   Vitals with BMI 08/07/2020 08/01/2020 06/14/2020  Height 5' 11"  5' 11"  5' 11"   Weight 198 lbs 3 oz 204 lbs 10 oz 206 lbs  BMI 27.66 44.92 01.00  Systolic 712 197 588  Diastolic 78 80 74  Pulse 87 102 105      Physical Exam Constitutional:      Appearance: He is well-developed.  HENT:     Head: Atraumatic.  Eyes:  Conjunctiva/sclera: Conjunctivae normal.  Neck:     Thyroid: No thyromegaly.     Vascular: No JVD.  Cardiovascular:     Rate and Rhythm: Normal rate and regular rhythm.     Pulses: Intact distal pulses.     Heart sounds: Normal heart sounds. No murmur heard. No gallop.      Comments: No leg edema, no JVD. Pulmonary:     Effort: Pulmonary effort is normal.     Breath sounds: Normal breath sounds.  Abdominal:     General: Bowel sounds are normal.     Palpations: Abdomen is soft.  Musculoskeletal:        General: Normal range of motion.     Cervical back: Neck supple.  Skin:    General: Skin is warm and dry.  Neurological:     Mental Status: He is alert.    Laboratory examination:    External  labs:    05/26/2019: Total cholesterol 134, triglycerides 86, HDL 43, LDL 74, non-HDL 91 Hemoglobin 14.2, hematocrit 40.4, MCV 94.6, platelets 212 9 sodium 134, potassium 4.2, glucose 89, BUN 16, creatinine 1.12, GFR 63 A1c 5.3% TSH 1.58  05/20/2018: CBC normal, PSA normal: Total cholesterol 130, triglycerides 79, HDL 49, LDL 65. Serum glucose 86 mg, BUN 20, creatinine 1.48, EGFR 28 mL, potassium 4.1.  CMP otherwise normal.   03/03/2014: Total cholesterol 162, triglycerides 92, HDL 46, LDL 98.  Medications and allergies   Allergies  Allergen Reactions  . Codeine Hives  . Other Itching and Other (See Comments)    OPIODS--All over itch  Including Laudanum  . Lorazepam     Pt stated, "I had a side effect -- I could not control my urination"  . Amoxicillin Other (See Comments)    Cold sores  Has patient had a PCN reaction causing immediate rash, facial/tongue/throat swelling, SOB or lightheadedness with hypotension: No Has patient had a PCN reaction causing severe rash involving mucus membranes or skin necrosis: Yes Has patient had a PCN reaction that required hospitalization: No Has patient had a PCN reaction occurring within the last 10 years: No If all of the above answers are "NO", then may proceed with Cephalosporin use.     Current Outpatient Medications on File Prior to Visit  Medication Sig Dispense Refill  . azelastine (ASTELIN) 0.1 % nasal spray Place 1 spray into both nostrils daily. Use in each nostril as directed    . buPROPion (WELLBUTRIN SR) 200 MG 12 hr tablet Take 200 mg by mouth 2 (two) times daily.    . busPIRone (BUSPAR) 10 MG tablet Take 1 tablet by mouth in the morning, at noon, and at bedtime. 2 tablets in the morning, 1 tablet at bedtime    . Cholecalciferol (VITAMIN D3) 25 MCG (1000 UT) CAPS Take 1 capsule by mouth daily.    Marland Kitchen docusate sodium (COLACE) 100 MG capsule 2 (two) times daily.    Marland Kitchen ezetimibe (ZETIA) 10 MG tablet Take 10 mg by mouth. M, W, Friday  only    . fluticasone (FLONASE) 50 MCG/ACT nasal spray Place 1 spray into both nostrils daily after supper.    . hydrochlorothiazide (HYDRODIURIL) 25 MG tablet Take 12.5 mg by mouth daily.    . methocarbamol (ROBAXIN) 500 MG tablet Take 1 tablet by mouth as needed.    . metroNIDAZOLE (METROCREAM) 0.75 % cream Apply topically as needed.    . NON FORMULARY ALTERIL Sleep Aid    . predniSONE (DELTASONE) 10 MG tablet Take  10 mg by mouth 2 (two) times daily.    Marland Kitchen terazosin (HYTRIN) 2 MG capsule Take 10 mg by mouth daily.     No current facility-administered medications on file prior to visit.     Radiology:  No results found. Cardiac Studies:   Echocardiogram 08/03/2020: Normal LV systolic function with visual EF 60-65%. Left ventricle cavity is normal in size. Mild left ventricular hypertrophy. Normal global wall motion. Normal diastolic filling pattern, normal LAP.  Mild tricuspid regurgitation. No evidence of pulmonary hypertension. The aortic root is dilated (sinus of valsalva 41m). Compared to prior study dated 11/28/2014: Aortic root dilatation measured at 3.7 cm and now its 4.cm, no significant change.    Treadmill exercise stress test 12/07/2014: Patient exercised for 9 minutes in Bruce stage III, achieving 10 METS workload.  There was no evidence of ischemia.  Normal blood pressure response.  Achieved 114% of MPHR.  EKG     EKG 06/06/2020: Sinus tachycardia at rate of 100 bpm, fetal enlargement, left axis deviation, left anterior fascicular block.  Incomplete right bundle branch block.  Poor R wave progression, cannot exclude anteroseptal infarct old.  IVCD and also voltage criteria for LVH. No significant change from  EKG 04/14/2019: Normal sinus rhythm at the rate of 91 bpm.   Assessment     ICD-10-CM   1. Essential hypertension  I10 diltiazem (CARDIZEM CD) 180 MG 24 hr capsule  2. Sinus tachycardia  R00.0   3. Aortic root dilatation (HCC)  I77.810 lisinopril (ZESTRIL) 5 MG  tablet    PCV ECHOCARDIOGRAM COMPLETE    Meds ordered this encounter  Medications  . diltiazem (CARDIZEM CD) 180 MG 24 hr capsule    Sig: Take 1 capsule (180 mg total) by mouth daily.    Dispense:  90 capsule    Refill:  3  . lisinopril (ZESTRIL) 5 MG tablet    Sig: Take 1 tablet (5 mg total) by mouth daily.    Dispense:  90 tablet    Refill:  3   Medications Discontinued During This Encounter  Medication Reason  . diltiazem (CARDIZEM CD) 180 MG 24 hr capsule Reorder   Recommendations:   Richard Williams is a 80y.o. Caucasian male with history of hypertension, mild aortic root dilatation, chronic dyspnea on exertion, GERD, lumbar disc disease. Patient presents for annual follow up of hypertension and elevated heart rate that is chronic.   Since last office visit after he discontinue lisinopril and switch him to diltiazem CD, states that he is feeling the best he has in quite a while with significant improvement in heart rate, also states that it makes him feel so much relaxed.  I reviewed his echocardiogram, LVEF is normal and aortic root dilatation has mildly progressed since 2007 where it was 3.7 to the present 4.0.  As his aortic root had remained stable over many years, he was on lisinopril 5 mg previously which I discontinued and started him on diltiazem for tachycardia, advised him to restart lisinopril 5 mg daily.  He will continue with diltiazem as he feels best presently without palpitations or elevated heart rate.  Blood pressure is also well controlled.  Otherwise stable from cardiac standpoint, in view of increased aortic root dilatation, I will repeat echocardiogram in a year and see him back at that time.    JAdrian Prows MD, FSt. [facility]4/08/2020, 4:03 PM Office: [phone]: [phone]

## 2020-08-08 ENCOUNTER — Other Ambulatory Visit: Payer: Self-pay

## 2020-08-08 DIAGNOSIS — I77810 Thoracic aortic ectasia: Secondary | ICD-10-CM

## 2020-08-08 MED ORDER — LISINOPRIL 5 MG PO TABS: 5.0000 mg | ORAL_TABLET | Freq: Every day | ORAL | 3 refills | Status: DC

## 2020-08-10 ENCOUNTER — Telehealth: Payer: Self-pay

## 2020-08-10 NOTE — Telephone Encounter (Signed)
Spoke with patient- referral, patient demographics, and office notes have been re-faxed to Dr. Vear Clock.

## 2020-08-10 NOTE — Telephone Encounter (Signed)
Patient left a voicemail regarding his referral to Dr. Thyra Breed.  Patient states they need some information and looking for a cover sheet.  Patient requested a return call.

## 2020-08-10 NOTE — Telephone Encounter (Signed)
Please advise 

## 2020-08-16 NOTE — Telephone Encounter (Signed)
From pt

## 2020-08-17 ENCOUNTER — Telehealth: Payer: Self-pay | Admitting: *Deleted

## 2020-08-17 NOTE — Telephone Encounter (Addendum)
Received DEXA results from [facility].  Date of Scan: 08/15/2020 Lowest T-score and lowest site measured: T-score 0.2, BMD 0.956. left femoral neck Significant changes in BMD and site measured (5% and above): n/a  Current Regimen: Vitamin D  Recommendation: WNL, repeat in 5 years  Reviewed by: Sherron Ales, PA-C  Next Appointment: 02/01/2021

## 2020-08-29 ENCOUNTER — Telehealth: Payer: Self-pay

## 2020-08-29 NOTE — Telephone Encounter (Signed)
Patient called stating he had his bone density scan a couple of weeks ago and hasn't received a call to discuss the results.  Patient requested a return call.

## 2020-08-30 NOTE — Telephone Encounter (Signed)
LMOM, WNL, repeat DEXA in 5 years.

## 2020-09-12 ENCOUNTER — Ambulatory Visit: Payer: Medicare Other | Admitting: Cardiology

## 2020-09-12 ENCOUNTER — Other Ambulatory Visit: Payer: Self-pay

## 2020-09-12 ENCOUNTER — Encounter: Payer: Self-pay | Admitting: Cardiology

## 2020-09-12 VITALS — BP 122/75 | HR 106 | Temp 98.3°F | Resp 17 | Ht 71.0 in | Wt 197.6 lb

## 2020-09-12 DIAGNOSIS — R600 Localized edema: Secondary | ICD-10-CM

## 2020-09-12 DIAGNOSIS — I77810 Thoracic aortic ectasia: Secondary | ICD-10-CM

## 2020-09-12 DIAGNOSIS — I10 Essential (primary) hypertension: Secondary | ICD-10-CM

## 2020-09-12 MED ORDER — DILTIAZEM HCL ER COATED BEADS 120 MG PO CP24: 120.0000 mg | ORAL_CAPSULE | Freq: Every day | ORAL | 3 refills | Status: DC

## 2020-09-12 NOTE — Progress Notes (Signed)
 Primary Physician/Referring:  Roberts, Ronald, MD  Patient ID: Richard Williams, male    DOB: 10/01/1940, 80 y.o.   MRN: [id number]  Chief Complaint  Patient presents with  . Hypertension  . Leg Swelling   HPI:    Frazer E Acklin  is a 80 y.o. Caucasian male with history of hypertension, mild aortic root dilatation, chronic dyspnea on exertion, GERD, lumbar disc disease.  Patient also has chronic elevated heart rate (sinus tachycardia).    On previous visit Dr. Ganji had started patient on diltiazem 180 mg for sinus tachycardia and at last visit had added lisinopril 5 mg in view of mild progression of aortic root dilation.  Patient now presents for urgent visit at his request for concerns of bilateral mild ankle swelling.  Patient states he noticed bilateral ankle swelling, left worse than right, about 1 month ago and it persisted for 2-3 weeks.  Patient reports approximately 3 days ago the swelling completely resolved.  Notably ankle swelling coincided with initiation of diltiazem.  Patient denies dyspnea, chest pain, palpitations, dizziness, leg swelling, orthopnea, PND.  Denies symptoms of claudication.  Past Medical History:  Diagnosis Date  . Anxiety   . Arthritis   . BPH (benign prostatic hypertrophy)    on meds  . Depression   . Dysrhythmia    1 episode of AF but never came back  . Enlarged aorta (HCC)   . GERD (gastroesophageal reflux disease)    on meds  . Hyperlipidemia   . Hypertension   . Neuromuscular disorder ([facility]CC)     right groin with numbness states he had back surgery in hopes to correct this, but still there  . Peripheral vascular disease ([facility])    surface blood clot x1 top of left foot  . Pneumonia    years ago 5,   Past Surgical History:  Procedure Laterality Date  . BACK SURGERY    . EYE SURGERY Bilateral    cataracts  . HERNIA REPAIR    . INGUINAL HERNIA REPAIR Left 11/13/2015   Procedure: OPEN REPAIR LEFT INGUINAL HERNIA;  Surgeon: Benjamin Hoxworth, MD;   Location: [facility];  Service: General;  Laterality: Left;  . LAMINECTOMY WITH POSTERIOR LATERAL ARTHRODESIS LEVEL 2 N/A 03/20/2017   Procedure: Posterior Lateral Fusion - L1 - L3, segmental fixation L1-3;  Surgeon: Jones, David S, MD;  Location: [facility];  Service: Neurosurgery;  Laterality: N/A;  . LUMBAR LAMINECTOMY/DECOMPRESSION MICRODISCECTOMY N/A 02/10/2014   Procedure: Lumbar One to Two, Lumbar Two to Three Lumbar laminectomy for stenosis/epidural mass;  Surgeon: David S Jones, MD;  Location: [facility];  Service: Neurosurgery;  Laterality: N/A;  L1-2 L2-3 Lumbar laminectomy for stenosis/epidural mass  . TONSILLECTOMY     Family History  Problem Relation Age of Onset  . Stroke Mother   . Heart Problems Father   . Macular degeneration Sister   . Healthy Daughter    Social History   Tobacco Use  . Smoking status: Never Smoker  . Smokeless tobacco: Never Used  Substance Use Topics  . Alcohol use: Yes    Comment: once every 6 months    Marital Status: Married  ROS  Review of Systems  Constitutional: Negative for malaise/fatigue and weight gain.  Cardiovascular: Positive for leg swelling (ankle, left > right). Negative for chest pain, claudication, dyspnea on exertion, near-syncope, orthopnea, palpitations, paroxysmal nocturnal dyspnea and syncope.  Respiratory: Negative for shortness of breath.   Hematologic/Lymphatic: Does not bruise/bleed easily.  Musculoskeletal: Positive   for back pain (scoliosis) and joint pain (degenerative arthritis).  Gastrointestinal: Negative for melena.  Neurological: Negative for dizziness and weakness.   Objective   Vitals with BMI 09/12/2020 08/07/2020 08/01/2020  Height 5' 11" 5' 11" 5' 11"  Weight 197 lbs 10 oz 198 lbs 3 oz 204 lbs 10 oz  BMI 27.57 47.42 59.56  Systolic 387 564 332  Diastolic 75 78 80  Pulse 951 87 102      Physical Exam Constitutional:      Appearance: He is well-developed.  HENT:     Head: Atraumatic.  Neck:     Thyroid:  No thyromegaly.     Vascular: No JVD.  Cardiovascular:     Rate and Rhythm: Normal rate and regular rhythm.     Pulses: Intact distal pulses.          Radial pulses are 2+ on the right side and 2+ on the left side.       Dorsalis pedis pulses are 2+ on the right side and 2+ on the left side.       Posterior tibial pulses are 2+ on the right side and 2+ on the left side.     Heart sounds: Normal heart sounds. No murmur heard. No gallop.      Comments: No leg edema, no JVD. Pulmonary:     Effort: Pulmonary effort is normal.     Breath sounds: Normal breath sounds. No wheezing or rales.  Musculoskeletal:        General: Normal range of motion.     Cervical back: Neck supple.     Right lower leg: No edema.     Left lower leg: No edema.  Skin:    General: Skin is warm and dry.     Findings: No erythema.  Neurological:     Mental Status: He is alert.    Laboratory examination:   CMP Latest Ref Rng & Units 03/11/2017 10/31/2015 02/03/2014  Glucose 65 - 99 mg/dL 125(H) 106(H) 103(H)  BUN 6 - 20 mg/dL 24(H) 27(H) 30(H)  Creatinine 0.61 - 1.24 mg/dL 1.29(H) 1.42(H) 1.19  Sodium 135 - 145 mmol/L 138 136 142  Potassium 3.5 - 5.1 mmol/L 4.0 4.2 4.5  Chloride 101 - 111 mmol/L 106 104 104  CO2 22 - 32 mmol/L _0 Calcium 8.9 - 10.3 mg/dL 9.2 9.6 9.1   CBC Latest Ref Rng & Units 03/11/2017 10/31/2015 02/03/2014  WBC 4.0 - 10.5 K/uL 9.0 7.7 6.2  Hemoglobin 13.0 - 17.0 g/dL 14.0 14.2 14.3  Hematocrit 39.0 - 52.0 % 40.5 41.8 40.7  Platelets 150 - 400 K/uL 183 180 173   Lipid Panel  No results found for: CHOL, TRIG, HDL, CHOLHDL, VLDL, LDLCALC, LDLDIRECT HEMOGLOBIN A1C No results found for: HGBA1C, MPG TSH No results for input(s): TSH in the last 8760 hours.  External labs:   05/26/2019: Total cholesterol 134, triglycerides 86, HDL 43, LDL 74, non-HDL 91 Hemoglobin 14.2, hematocrit 40.4, MCV 94.6, platelets 212 9 sodium 134, potassium 4.2, glucose 89, BUN 16, creatinine 1.12, GFR  63 A1c 5.3% TSH 1.58  05/20/2018:  CBC normal, PSA normal: Total cholesterol 130, triglycerides 79, HDL 49, LDL 65. Serum glucose 86 mg, BUN 20, creatinine 1.48, EGFR 28 mL, potassium 4.1.  CMP otherwise normal.   03/03/2014:  Total cholesterol 162, triglycerides 92, HDL 46, LDL 98.  Medications and allergies   Allergies  Allergen Reactions  . Codeine Hives  . Other Itching and Other (See  Comments)    OPIODS--All over itch  Including Laudanum  . Lorazepam     Pt stated, "I had a side effect -- I could not control my urination"  . Amoxicillin Other (See Comments)    Cold sores  Has patient had a PCN reaction causing immediate rash, facial/tongue/throat swelling, SOB or lightheadedness with hypotension: No Has patient had a PCN reaction causing severe rash involving mucus membranes or skin necrosis: Yes Has patient had a PCN reaction that required hospitalization: No Has patient had a PCN reaction occurring within the last 10 years: No If all of the above answers are "NO", then may proceed with Cephalosporin use.     Current Outpatient Medications on File Prior to Visit  Medication Sig Dispense Refill  . azelastine (ASTELIN) 0.1 % nasal spray Place 1 spray into both nostrils daily. Use in each nostril as directed    . Cholecalciferol (VITAMIN D3) 25 MCG (1000 UT) CAPS Take 1 capsule by mouth daily.    . DULoxetine (CYMBALTA) 30 MG capsule Take 30 mg by mouth daily.    . ezetimibe (ZETIA) 10 MG tablet Take 10 mg by mouth. M, W, Friday only    . fluticasone (FLONASE) 50 MCG/ACT nasal spray Place 1 spray into both nostrils daily after supper.    . lisinopril (ZESTRIL) 5 MG tablet Take 1 tablet (5 mg total) by mouth daily. 90 tablet 3  . NON FORMULARY ALTERIL Sleep Aid    . Ophthalmic Irrigation Solution (OCUSOFT EYE WASH OP) Apply 1 application to eye at bedtime.    . Sennosides (SENNA LAX PO) Take 2 tablets by mouth at bedtime.    . terazosin (HYTRIN) 2 MG capsule Take 10 mg by  mouth daily.     No current facility-administered medications on file prior to visit.     Radiology:  No results found. Cardiac Studies:   Echocardiogram 08/03/2020: Normal LV systolic function with visual EF 60-65%. Left ventricle cavity is normal in size. Mild left ventricular hypertrophy. Normal global wall motion. Normal diastolic filling pattern, normal LAP.  Mild tricuspid regurgitation. No evidence of pulmonary hypertension. The aortic root is dilated (sinus of valsalva 43mm). Compared to prior study dated 11/28/2014: Aortic root dilatation measured at 3.7 cm and now its 4.cm, no significant change.    Treadmill exercise stress test 12/07/2014: Patient exercised for 9 minutes in Bruce stage III, achieving 10 METS workload.  There was no evidence of ischemia.  Normal blood pressure response.  Achieved 114% of MPHR.  EKG   EKG 09/12/2020: Sinus tachycardia at a rate of 106 bpm.  Left atrial enlargement.  Left axis, left anterior fascicular block.  Incomplete right bundle branch block.  Poor R wave progression, cannot exclude anteroseptal infarct old.  LVH.  Compared to EKG 06/06/2020, no significant change.  EKG 06/06/2020: Sinus tachycardia at rate of 100 bpm, fetal enlargement, left axis deviation, left anterior fascicular block.  Incomplete right bundle branch block.  Poor R wave progression, cannot exclude anteroseptal infarct old.  IVCD and also voltage criteria for LVH. No significant change from  EKG 04/14/2019: Normal sinus rhythm at the rate of 91 bpm.   Assessment     ICD-10-CM   1. Bilateral leg edema  R60.0   2. Essential hypertension  I10 EKG 12-Lead  3. Aortic root dilatation (HCC)  I77.810     Meds ordered this encounter  Medications  . diltiazem (CARDIZEM CD) 120 MG 24 hr capsule    Sig: Take 1   capsule (120 mg total) by mouth daily.    Dispense:  30 capsule    Refill:  3   Medications Discontinued During This Encounter  Medication Reason  . buPROPion  (WELLBUTRIN SR) 200 MG 12 hr tablet Error  . busPIRone (BUSPAR) 10 MG tablet Error  . docusate sodium (COLACE) 100 MG capsule Error  . hydrochlorothiazide (HYDRODIURIL) 25 MG tablet Error  . methocarbamol (ROBAXIN) 500 MG tablet Error  . metroNIDAZOLE (METROCREAM) 0.75 % cream Error  . predniSONE (DELTASONE) 10 MG tablet Error  . diltiazem (CARDIZEM CD) 180 MG 24 hr capsule Side effect (s)   Recommendations:   Hulan E Vasbinder  is a 79 y.o. Caucasian male with history of hypertension, mild aortic root dilatation, chronic dyspnea on exertion, GERD, lumbar disc disease.  Patient also has chronic elevated heart rate (sinus tachycardia).    On previous visit Dr. Ganji had started patient on diltiazem 180 mg for sinus tachycardia and at last visit had added lisinopril 5 mg in view of mild progression of aortic root dilation.  Patient now presents for urgent visit at his request for concerns of bilateral mild ankle swelling.  Suspect the swelling is likely secondary to diltiazem.  Therefore discussed with patient option of discontinuing diltiazem and starting beta-blocker therapy.  However patient is tolerating diltiazem well at this time as he has had complete resolution of bilateral ankle swelling.  Therefore in view of bifascicular block shared decision was not to switch to beta-blocker therapy, but rather reduce diltiazem CD from 180 mg to 120 mg daily.   Patient will continue to monitor blood pressure and heart rate on a regular basis as well as notify our office if he experiences recurrence of ankle swelling.  Follow-up in 6 weeks, sooner if needed, for hypertension and ankle swelling.   Celeste C Cantwell, PA-C 09/12/2020, 5:28 PM Office: [phone] 

## 2020-09-19 ENCOUNTER — Telehealth: Payer: Self-pay

## 2020-09-19 NOTE — Telephone Encounter (Signed)
Patient called to let Dr. Corliss Skains know that he is scheduled for an appointment at [facility] on Tuesday, 11/21/20.

## 2020-09-28 DIAGNOSIS — R000 Tachycardia, unspecified: Secondary | ICD-10-CM

## 2020-09-28 DIAGNOSIS — I10 Essential (primary) hypertension: Secondary | ICD-10-CM

## 2020-09-28 DIAGNOSIS — R600 Localized edema: Secondary | ICD-10-CM

## 2020-09-28 MED ORDER — METOPROLOL SUCCINATE ER 50 MG PO TB24: 50.0000 mg | ORAL_TABLET | Freq: Every day | ORAL | 2 refills | Status: DC

## 2020-09-28 NOTE — Telephone Encounter (Signed)
ICD-10-CM   1. Bilateral leg edema  R60.0   2. Essential hypertension  I10 metoprolol succinate (TOPROL-XL) 50 MG 24 hr tablet  3. Sinus tachycardia  R00.0 metoprolol succinate (TOPROL-XL) 50 MG 24 hr tablet   Medications Discontinued During This Encounter  Medication Reason  . diltiazem (CARDIZEM CD) 120 MG 24 hr capsule Side effect (s)    Meds ordered this encounter  Medications  . metoprolol succinate (TOPROL-XL) 50 MG 24 hr tablet    Sig: Take 1 tablet (50 mg total) by mouth daily. Take with or immediately following a meal.    Dispense:  30 tablet    Refill:  2    Discontinue Diltiazem - edema     Yates Decamp, MD, [facility] 09/28/2020, 6:23 PM Office: [phone] Fax: [phone] Pager: [phone]

## 2020-10-25 ENCOUNTER — Ambulatory Visit: Payer: Medicare Other | Admitting: Cardiology

## 2020-11-16 ENCOUNTER — Ambulatory Visit: Payer: Medicare Other | Admitting: Cardiology

## 2020-11-16 ENCOUNTER — Encounter: Payer: Self-pay | Admitting: Cardiology

## 2020-11-16 ENCOUNTER — Other Ambulatory Visit: Payer: Self-pay

## 2020-11-16 VITALS — BP 139/80 | HR 66 | Temp 98.1°F | Resp 17 | Ht 71.0 in | Wt 204.4 lb

## 2020-11-16 DIAGNOSIS — R000 Tachycardia, unspecified: Secondary | ICD-10-CM

## 2020-11-16 DIAGNOSIS — I77810 Thoracic aortic ectasia: Secondary | ICD-10-CM

## 2020-11-16 DIAGNOSIS — I10 Essential (primary) hypertension: Secondary | ICD-10-CM

## 2020-11-16 MED ORDER — METOPROLOL SUCCINATE ER 50 MG PO TB24: 50.0000 mg | ORAL_TABLET | Freq: Every day | ORAL | 3 refills | Status: DC

## 2020-11-16 NOTE — Progress Notes (Signed)
Primary Physician/Referring:  [name], MD  Patient ID: Richard Williams, male    DOB: Jan 20, 1941, 80 y.o.   MRN: [id number]  Chief Complaint  Patient presents with  . Leg Swelling  . Tachycardia    6 month   HPI:    Richard Williams  is a 80 y.o. Caucasian male with history of hypertension, mild aortic root dilatation, chronic dyspnea on exertion, GERD, lumbar disc disease.  Patient also has chronic elevated heart rate (sinus tachycardia).    He was seen by me in April 2022 and had started him on diltiazem both for hypertension and underlying sinus tachycardia which he responded well but then developed leg edema.  He is now on metoprolol and is tolerating this very well and very minimal trace ankle edema on the left is present.  He feels the best, states that his heart rate is improved significantly, he has not having any further palpitations or elevated heart rate.   Past Medical History:  Diagnosis Date  . Anxiety   . Arthritis   . BPH (benign prostatic hypertrophy)    on meds  . Depression   . Dysrhythmia    1 episode of AF but never came back  . Enlarged aorta ([place])   . GERD (gastroesophageal reflux disease)    on meds  . Hyperlipidemia   . Hypertension   . Neuromuscular disorder ([place])     right groin with numbness states he had back surgery in hopes to correct this, but still there  . Peripheral vascular disease ([place])    surface blood clot x1 top of left foot  . Pneumonia    years ago 9,   Past Surgical History:  Procedure Laterality Date  . BACK SURGERY    . EYE SURGERY Bilateral    cataracts  . HERNIA REPAIR    . INGUINAL HERNIA REPAIR Left 11/13/2015   Procedure: OPEN REPAIR LEFT INGUINAL HERNIA;  Surgeon: Excell Seltzer, MD;  Location: [facility];  Service: General;  Laterality: Left;  . LAMINECTOMY WITH POSTERIOR LATERAL ARTHRODESIS LEVEL 2 N/A 03/20/2017   Procedure: Posterior Lateral Fusion - L1 - L3, segmental fixation L1-3;  Surgeon: Eustace Moore, MD;   Location: [facility];  Service: Neurosurgery;  Laterality: N/A;  . LUMBAR LAMINECTOMY/DECOMPRESSION MICRODISCECTOMY N/A 02/10/2014   Procedure: Lumbar One to Two, Lumbar Two to Three Lumbar laminectomy for stenosis/epidural mass;  Surgeon: Eustace Moore, MD;  Location: [facility];  Service: Neurosurgery;  Laterality: N/A;  L1-2 L2-3 Lumbar laminectomy for stenosis/epidural mass  . TONSILLECTOMY     Family History  Problem Relation Age of Onset  . Stroke Mother   . Heart Problems Father   . Macular degeneration Sister   . Healthy Daughter    Social History   Tobacco Use  . Smoking status: Never  . Smokeless tobacco: Never  Substance Use Topics  . Alcohol use: Yes    Comment: once every 6 months    Marital Status: Married  ROS  Review of Systems  Cardiovascular:  Positive for leg swelling (Minimal left ankle).  Musculoskeletal:  Positive for back pain.  Objective   Vitals with BMI 11/16/2020 11/16/2020 09/12/2020  Height - 5' 11" 5' 11"  Weight - 204 lbs 6 oz 197 lbs 10 oz  BMI - 33.54 56.25  Systolic 638 937 342  Diastolic 80 79 75  Pulse 66 75 106      Physical Exam Constitutional:      Appearance:  He is well-developed.  Neck:     Thyroid: No thyromegaly.     Vascular: No JVD.  Cardiovascular:     Rate and Rhythm: Normal rate and regular rhythm.     Pulses: Intact distal pulses.          Radial pulses are 2+ on the right side and 2+ on the left side.       Dorsalis pedis pulses are 2+ on the right side and 2+ on the left side.       Posterior tibial pulses are 2+ on the right side and 2+ on the left side.     Heart sounds: Normal heart sounds. No murmur heard.   No gallop.     Comments: No leg edema, no JVD. Pulmonary:     Effort: Pulmonary effort is normal.     Breath sounds: Normal breath sounds. No wheezing or rales.  Musculoskeletal:        General: Normal range of motion.     Cervical back: Neck supple.     Right lower leg: No edema.     Left lower leg: No  edema (Trace pitting edema).  Skin:    General: Skin is warm and dry.     Findings: No erythema.   Laboratory examination:   CMP Latest Ref Rng & Units 03/11/2017 10/31/2015 02/03/2014  Glucose 65 - 99 mg/dL 125(H) 106(H) 103(H)  BUN 6 - 20 mg/dL 24(H) 27(H) 30(H)  Creatinine 0.61 - 1.24 mg/dL 1.29(H) 1.42(H) 1.19  Sodium 135 - 145 mmol/L 138 136 142  Potassium 3.5 - 5.1 mmol/L 4.0 4.2 4.5  Chloride 101 - 111 mmol/L 106 104 104  CO2 22 - 32 mmol/L _0 Calcium 8.9 - 10.3 mg/dL 9.2 9.6 9.1   CBC Latest Ref Rng & Units 03/11/2017 10/31/2015 02/03/2014  WBC 4.0 - 10.5 K/uL 9.0 7.7 6.2  Hemoglobin 13.0 - 17.0 g/dL 14.0 14.2 14.3  Hematocrit 39.0 - 52.0 % 40.5 41.8 40.7  Platelets 150 - 400 K/uL 183 180 173   Lipid Panel  No results found for: CHOL, TRIG, HDL, CHOLHDL, VLDL, LDLCALC, LDLDIRECT HEMOGLOBIN A1C No results found for: HGBA1C, MPG TSH No results for input(s): TSH in the last 8760 hours.  External labs:   05/26/2019: Total cholesterol 134, triglycerides 86, HDL 43, LDL 74, non-HDL 91 Hemoglobin 14.2, hematocrit 40.4, MCV 94.6, platelets 212 9 sodium 134, potassium 4.2, glucose 89, BUN 16, creatinine 1.12, GFR 63 A1c 5.3% TSH 1.58  05/20/2018:  CBC normal, PSA normal: Total cholesterol 130, triglycerides 79, HDL 49, LDL 65. Serum glucose 86 mg, BUN 20, creatinine 1.48, EGFR 28 mL, potassium 4.1.  CMP otherwise normal.   03/03/2014:  Total cholesterol 162, triglycerides 92, HDL 46, LDL 98.  Medications and allergies   Allergies  Allergen Reactions  . Codeine Hives  . Other Itching and Other (See Comments)    OPIODS--All over itch  Including Laudanum  . Lorazepam     Pt stated, "I had a side effect -- I could not control my urination"  . Amoxicillin Other (See Comments)    Cold sores  Has patient had a PCN reaction causing immediate rash, facial/tongue/throat swelling, SOB or lightheadedness with hypotension: No Has patient had a PCN reaction causing severe  rash involving mucus membranes or skin necrosis: Yes Has patient had a PCN reaction that required hospitalization: No Has patient had a PCN reaction occurring within the last 10 years: No If all of the above  answers are "NO", then may proceed with Cephalosporin use.     Current Outpatient Medications on File Prior to Visit  Medication Sig Dispense Refill  . azelastine (ASTELIN) 0.1 % nasal spray Place 1 spray into both nostrils daily. Use in each nostril as directed    . Cholecalciferol (VITAMIN D3) 25 MCG (1000 UT) CAPS Take 1 capsule by mouth daily.    . DULoxetine (CYMBALTA) 30 MG capsule Take 30 mg by mouth daily.    Marland Kitchen esomeprazole (NEXIUM) 40 MG capsule Take 40 mg by mouth daily.    Marland Kitchen ezetimibe (ZETIA) 10 MG tablet Take 10 mg by mouth. M, W, Friday only    . fluticasone (FLONASE) 50 MCG/ACT nasal spray Place 1 spray into both nostrils daily after supper.    Marland Kitchen lisinopril (ZESTRIL) 5 MG tablet Take 1 tablet (5 mg total) by mouth daily. 90 tablet 3  . Magnesium Hydroxide (DULCOLAX PO) Take 2 tablets by mouth daily.    . metoprolol succinate (TOPROL-XL) 50 MG 24 hr tablet Take 1 tablet (50 mg total) by mouth daily. Take with or immediately following a meal. 30 tablet 2  . NON FORMULARY ALTERIL Sleep Aid    . NUCYNTA 50 MG tablet Take 50 mg by mouth as needed.    Marland Kitchen Ophthalmic Irrigation Solution (OCUSOFT EYE Achille OP) Apply 1 application to eye at bedtime.    . Sennosides (SENNA LAX PO) Take 2 tablets by mouth at bedtime.    Marland Kitchen terazosin (HYTRIN) 5 MG capsule Take 10 mg by mouth at bedtime.     No current facility-administered medications on file prior to visit.     Radiology:  No results found. Cardiac Studies:   Echocardiogram 08/03/2020: Normal LV systolic function with visual EF 60-65%. Left ventricle cavity is normal in size. Mild left ventricular hypertrophy. Normal global wall motion. Normal diastolic filling pattern, normal LAP.  Mild tricuspid regurgitation. No evidence of  pulmonary hypertension. The aortic root is dilated (sinus of valsalva 15m). Compared to prior study dated 11/28/2014: Aortic root dilatation measured at 3.7 cm and now its 4.cm, no significant change.    Treadmill exercise stress test 12/07/2014: Patient exercised for 9 minutes in Bruce stage III, achieving 10 METS workload.  There was no evidence of ischemia.  Normal blood pressure response.  Achieved 114% of MPHR.  EKG   EKG 09/12/2020: Sinus tachycardia at a rate of 106 bpm.  Left atrial enlargement.  Left axis, left anterior fascicular block.  Incomplete right bundle branch block.  Poor R wave progression, cannot exclude anteroseptal infarct old.  LVH.  Compared to EKG 06/06/2020, no significant change.  EKG 06/06/2020: Sinus tachycardia at rate of 100 bpm, fetal enlargement, left axis deviation, left anterior fascicular block.  Incomplete right bundle branch block.  Poor R wave progression, cannot exclude anteroseptal infarct old.  IVCD and also voltage criteria for LVH. No significant change from  EKG 04/14/2019: Normal sinus rhythm at the rate of 91 bpm.   Assessment     ICD-10-CM   1. Sinus tachycardia  R00.0     2. Essential hypertension  I10     3. Aortic root dilatation (HCC)  I77.810       No orders of the defined types were placed in this encounter.  Medications Discontinued During This Encounter  Medication Reason  . terazosin (HYTRIN) 2 MG capsule Change in therapy   Recommendations:   RLAURENS MATHENY is a 80y.o. Caucasian male with history of  hypertension, mild aortic root dilatation, chronic dyspnea on exertion, GERD, lumbar disc disease.  Patient also has chronic elevated heart rate (sinus tachycardia).    He was seen by me in April 2022 and had started him on diltiazem both for hypertension and underlying sinus tachycardia which he responded well but then developed leg edema.  He is now on metoprolol and is tolerating this very well and very minimal trace ankle edema  on the left is present.  Overall he feels well.  No clinical evidence of heart failure.  I reviewed his echocardiogram revealing aortic root dilatation, will need surveillance of this and will repeat echocardiogram in 1 year.  Otherwise continue present medications, I will see him back in a year.  Blood pressure is also well controlled.   Jay Ganji, MD, FACC 11/16/2020, 3:45 PM Office: [phone] Fax: [phone] Pager: [phone]  

## 2021-01-19 NOTE — Progress Notes (Deleted)
Office Visit Note  Patient: Richard Williams             Date of Birth: March 25, 1941           MRN: [id number]             PCP: Burton Apley, MD Referring: Burton Apley, MD Visit Date: 02/01/2021 Occupation: @GUAROCC @  Subjective:  No chief complaint on file.   History of Present Illness: Richard Williams is a 80 y.o. male ***   Activities of Daily Living:  Patient reports morning stiffness for *** {minute/hour:19697}.   Patient {ACTIONS;DENIES/REPORTS:21021675::"Denies"} nocturnal pain.  Difficulty dressing/grooming: {ACTIONS;DENIES/REPORTS:21021675::"Denies"} Difficulty climbing stairs: {ACTIONS;DENIES/REPORTS:21021675::"Denies"} Difficulty getting out of chair: {ACTIONS;DENIES/REPORTS:21021675::"Denies"} Difficulty using hands for taps, buttons, cutlery, and/or writing: {ACTIONS;DENIES/REPORTS:21021675::"Denies"}  No Rheumatology ROS completed.   PMFS History:  Patient Active Problem List   Diagnosis Date Noted   Primary osteoarthritis of both hands 02/01/2019   S/P lumbar spinal fusion 03/20/2017   Recurrent left inguinal hernia 11/13/2015   S/P lumbar laminectomy 02/10/2014    Past Medical History:  Diagnosis Date   Anxiety    Arthritis    BPH (benign prostatic hypertrophy)    on meds   Depression    Dysrhythmia    1 episode of AF but never came back   Enlarged aorta ([facility])    GERD (gastroesophageal reflux disease)    on meds   Hyperlipidemia    Hypertension    Neuromuscular disorder ([facility])     right groin with numbness states he had back surgery in hopes to correct this, but still there   Peripheral vascular disease ([facility])    surface blood clot x1 top of left foot   Pneumonia    years ago 5,    Family History  Problem Relation Age of Onset   Stroke Mother    Heart Problems Father    Macular degeneration Sister    Healthy Daughter    Past Surgical History:  Procedure Laterality Date   BACK SURGERY     EYE SURGERY Bilateral    cataracts   HERNIA  REPAIR     INGUINAL HERNIA REPAIR Left 11/13/2015   Procedure: OPEN REPAIR LEFT INGUINAL HERNIA;  Surgeon: 01/14/2016, MD;  Location: [facility];  Service: General;  Laterality: Left;   LAMINECTOMY WITH POSTERIOR LATERAL ARTHRODESIS LEVEL 2 N/A 03/20/2017   Procedure: Posterior Lateral Fusion - L1 - L3, segmental fixation L1-3;  Surgeon: 03/22/2017, MD;  Location: [facility];  Service: Neurosurgery;  Laterality: N/A;   LUMBAR LAMINECTOMY/DECOMPRESSION MICRODISCECTOMY N/A 02/10/2014   Procedure: Lumbar One to Two, Lumbar Two to Three Lumbar laminectomy for stenosis/epidural mass;  Surgeon: 04/12/2014, MD;  Location: [facility];  Service: Neurosurgery;  Laterality: N/A;  L1-2 L2-3 Lumbar laminectomy for stenosis/epidural mass   TONSILLECTOMY     Social History   Social History Narrative   Not on file   Immunization History  Administered Date(s) Administered   Influenza, High Dose Seasonal PF 02/05/2019   Moderna Sars-Covid-2 Vaccination 05/18/2019, 06/28/2019, 02/28/2020   Zoster Recombinat (Shingrix) 07/16/2017     Objective: Vital Signs: There were no vitals taken for this visit.   Physical Exam   Musculoskeletal Exam: ***  CDAI Exam: CDAI Score: -- Patient Global: --; Provider Global: -- Swollen: --; Tender: -- Joint Exam 02/01/2021   No joint exam has been documented for this visit   There is currently no information documented on the homunculus. Go to the Rheumatology activity  and complete the homunculus joint exam.  Investigation: No additional findings.  Imaging: No results found.  Recent Labs: Lab Results  Component Value Date   WBC 9.0 03/11/2017   HGB 14.0 03/11/2017   PLT 183 03/11/2017   NA 138 03/11/2017   K 4.0 03/11/2017   CL 106 03/11/2017   CO2 24 03/11/2017   GLUCOSE 125 (H) 03/11/2017   BUN 24 (H) 03/11/2017   CREATININE 1.29 (H) 03/11/2017   CALCIUM 9.2 03/11/2017   GFRAA >60 03/11/2017    Speciality Comments: No specialty comments  available.  Procedures:  No procedures performed Allergies: Codeine, Other, Lorazepam, and Amoxicillin   Assessment / Plan:     Visit Diagnoses: No diagnosis found.  Orders: No orders of the defined types were placed in this encounter.  No orders of the defined types were placed in this encounter.   Face-to-face time spent with patient was *** minutes. Greater than 50% of time was spent in counseling and coordination of care.  Follow-Up Instructions: No follow-ups on file.   Ellen Henri, CMA  Note - This record has been created using Animal nutritionist.  Chart creation errors have been sought, but may not always  have been located. Such creation errors do not reflect on  the standard of medical care.

## 2021-02-01 ENCOUNTER — Ambulatory Visit: Payer: Medicare Other | Admitting: Rheumatology

## 2021-02-01 DIAGNOSIS — F32A Depression, unspecified: Secondary | ICD-10-CM

## 2021-02-01 DIAGNOSIS — I10 Essential (primary) hypertension: Secondary | ICD-10-CM

## 2021-02-01 DIAGNOSIS — Z981 Arthrodesis status: Secondary | ICD-10-CM

## 2021-02-01 DIAGNOSIS — M19071 Primary osteoarthritis, right ankle and foot: Secondary | ICD-10-CM

## 2021-02-01 DIAGNOSIS — Z13820 Encounter for screening for osteoporosis: Secondary | ICD-10-CM

## 2021-02-01 DIAGNOSIS — Z8639 Personal history of other endocrine, nutritional and metabolic disease: Secondary | ICD-10-CM

## 2021-02-01 DIAGNOSIS — M65312 Trigger thumb, left thumb: Secondary | ICD-10-CM

## 2021-02-01 DIAGNOSIS — M19041 Primary osteoarthritis, right hand: Secondary | ICD-10-CM

## 2021-02-01 DIAGNOSIS — G8929 Other chronic pain: Secondary | ICD-10-CM

## 2021-02-09 NOTE — Progress Notes (Signed)
Office Visit Note  Patient: Richard Williams             Date of Birth: May 13, 1940           MRN: [id number]             PCP: Burton Apley, MD Referring: Burton Apley, MD Visit Date: 02/22/2021 Occupation: @GUAROCC @  Subjective:  Joint pain and stiffness.   History of Present Illness: Richard Williams is a 80 y.o. male with a history of osteoarthritis.  He states he continues to have some stiffness in his hands.  His left trigger thumb improved after the cortisone injection and July 2020.  He has had no recurrence of the symptoms.  He has off-and-on discomfort in his left knee joint but no swelling.  He states that he props his legs up on the recliner when he sits and sometimes the pain radiates down into his leg.  He continues to have some lower back pain.  Activities of Daily Living:  Patient reports morning stiffness for 15-20 minutes.   Patient Denies nocturnal pain.  Difficulty dressing/grooming: Denies Difficulty climbing stairs: Denies Difficulty getting out of chair: Denies Difficulty using hands for taps, buttons, cutlery, and/or writing: Denies  Review of Systems  Constitutional:  Negative for fatigue.  HENT:  Positive for mouth dryness. Negative for mouth sores and nose dryness.   Eyes:  Negative for pain, itching and dryness.  Respiratory:  Negative for shortness of breath and difficulty breathing.   Cardiovascular:  Positive for swelling in legs/feet. Negative for chest pain and palpitations.  Gastrointestinal:  Positive for constipation. Negative for blood in stool and diarrhea.  Endocrine: Negative for increased urination.  Genitourinary:  Negative for difficulty urinating.  Musculoskeletal:  Positive for myalgias, morning stiffness and myalgias. Negative for joint pain, joint pain, joint swelling and muscle tenderness.  Skin:  Negative for color change, rash and redness.  Allergic/Immunologic: Negative for susceptible to infections.  Neurological:  Positive for  numbness. Negative for dizziness, headaches, memory loss and weakness.  Hematological:  Negative for bruising/bleeding tendency.  Psychiatric/Behavioral:  Negative for confusion.    PMFS History:  Patient Active Problem List   Diagnosis Date Noted   Primary osteoarthritis of both hands 02/01/2019   S/P lumbar spinal fusion 03/20/2017   Recurrent left inguinal hernia 11/13/2015   S/P lumbar laminectomy 02/10/2014    Past Medical History:  Diagnosis Date   Anxiety    Arthritis    BPH (benign prostatic hypertrophy)    on meds   Depression    Dysrhythmia    1 episode of AF but never came back   Enlarged aorta ([facility])    GERD (gastroesophageal reflux disease)    on meds   Hyperlipidemia    Hypertension    Neuromuscular disorder ([facility])     right groin with numbness states he had back surgery in hopes to correct this, but still there   Peripheral vascular disease ([facility])    surface blood clot x1 top of left foot   Pneumonia    years ago 5,    Family History  Problem Relation Age of Onset   Stroke Mother    Heart Problems Father    Macular degeneration Sister    Healthy Daughter    Past Surgical History:  Procedure Laterality Date   BACK SURGERY     EYE SURGERY Bilateral    cataracts   HERNIA REPAIR     INGUINAL HERNIA REPAIR Left 11/13/2015  Procedure: OPEN REPAIR LEFT INGUINAL HERNIA;  Surgeon: Glenna Fellows, MD;  Location: [facility];  Service: General;  Laterality: Left;   LAMINECTOMY WITH POSTERIOR LATERAL ARTHRODESIS LEVEL 2 N/A 03/20/2017   Procedure: Posterior Lateral Fusion - L1 - L3, segmental fixation L1-3;  Surgeon: Tia Alert, MD;  Location: [facility] OR;  Service: Neurosurgery;  Laterality: N/A;   LUMBAR LAMINECTOMY/DECOMPRESSION MICRODISCECTOMY N/A 02/10/2014   Procedure: Lumbar One to Two, Lumbar Two to Three Lumbar laminectomy for stenosis/epidural mass;  Surgeon: Tia Alert, MD;  Location: [facility];  Service: Neurosurgery;  Laterality: N/A;  L1-2 L2-3 Lumbar  laminectomy for stenosis/epidural mass   TONSILLECTOMY     Social History   Social History Narrative   Not on file   Immunization History  Administered Date(s) Administered   Influenza, High Dose Seasonal PF 02/05/2019   Moderna Covid-19 Vaccine Bivalent Booster 35yrs & up 02/15/2021   Moderna Sars-Covid-2 Vaccination 05/18/2019, 06/28/2019, 02/28/2020, 11/10/2020   Zoster Recombinat (Shingrix) 07/16/2017     Objective: Vital Signs: BP (!) 150/87 (BP Location: Left Arm, Patient Position: Sitting, Cuff Size: Normal)   Pulse 64   Ht 5\' 11"  (1.803 m)   Wt 205 lb 9.6 oz (93.3 kg)   BMI 28.68 kg/m    Physical Exam Vitals and nursing note reviewed.  Constitutional:      Appearance: He is well-developed.  HENT:     Head: Normocephalic and atraumatic.  Eyes:     Conjunctiva/sclera: Conjunctivae normal.     Pupils: Pupils are equal, round, and reactive to light.  Cardiovascular:     Rate and Rhythm: Normal rate and regular rhythm.     Heart sounds: Normal heart sounds.  Pulmonary:     Effort: Pulmonary effort is normal.     Breath sounds: Normal breath sounds.  Abdominal:     General: Bowel sounds are normal.     Palpations: Abdomen is soft.  Musculoskeletal:     Cervical back: Normal range of motion and neck supple.  Skin:    General: Skin is warm and dry.     Capillary Refill: Capillary refill takes less than 2 seconds.  Neurological:     Mental Status: He is alert and oriented to person, place, and time.  Psychiatric:        Behavior: Behavior normal.     Musculoskeletal Exam: He had limited lateral rotation of his cervical spine.  Shoulder joints, elbow joints, wrist joints, MCPs PIPs and DIPs with good range of motion with no synovitis.  PIP and DIP thickening was noted.  Hip joints and knee joints with good range of motion with no warmth swelling or effusion.  There was no tenderness over ankles or MTPs.  CDAI Exam: CDAI Score: -- Patient Global: --; Provider  Global: -- Swollen: --; Tender: -- Joint Exam 02/22/2021   No joint exam has been documented for this visit   There is currently no information documented on the homunculus. Go to the Rheumatology activity and complete the homunculus joint exam.  Investigation: No additional findings.  Imaging: No results found.  Recent Labs: Lab Results  Component Value Date   WBC 9.0 03/11/2017   HGB 14.0 03/11/2017   PLT 183 03/11/2017   NA 138 03/11/2017   K 4.0 03/11/2017   CL 106 03/11/2017   CO2 24 03/11/2017   GLUCOSE 125 (H) 03/11/2017   BUN 24 (H) 03/11/2017   CREATININE 1.29 (H) 03/11/2017   CALCIUM 9.2 03/11/2017   GFRAA >  60 03/11/2017    Speciality Comments: No specialty comments available.  Procedures:  No procedures performed Allergies: Codeine, Other, Lorazepam, and Amoxicillin   Assessment / Plan:     Visit Diagnoses: Primary osteoarthritis of both hands-bilateral PIP and DIP thickening was noted.  He had no synovitis on examination.  Joint protection muscle strengthening was discussed.  Trigger thumb, left thumb - Injected on December 01, 2018.  Resolved.  He continues to do well.  Chronic pain of left knee - X-rays of left knee and left ankle ordered at Dr. Veda Canning office were unremarkable according to the patient.  He appears to have some discomfort in the lateral aspect of the knee which appears to be tendinitis.  Stretching exercises were demonstrated and discussed.  Primary osteoarthritis of both feet-proper fitting shoes were discussed.  Neck stiffness-he had limited lateral rotation.  Stretching exercises were demonstrated in the office today.  S/P lumbar spinal fusion - history of lumbar spine fusion x2.  He continues to have lower back discomfort.  Core strengthening exercises were demonstrated in the office today.  Anxiety and depression  Essential hypertension  History of hyperlipidemia  Osteoporosis screening - DEXA 08/15/2020 T-score: 0.2, BMD:  0.956 left femoral neck.  DEXA findings were discussed with the patient.  Calcium rich diet and regular exercise was emphasized.  Orders: No orders of the defined types were placed in this encounter.  No orders of the defined types were placed in this encounter.   Follow-Up Instructions: Return in about 1 year (around 02/22/2022) for Osteoarthritis.   Pollyann Savoy, MD  Note - This record has been created using Animal nutritionist.  Chart creation errors have been sought, but may not always  have been located. Such creation errors do not reflect on  the standard of medical care.

## 2021-02-22 ENCOUNTER — Other Ambulatory Visit: Payer: Self-pay

## 2021-02-22 ENCOUNTER — Ambulatory Visit: Payer: Medicare Other | Admitting: Rheumatology

## 2021-02-22 ENCOUNTER — Encounter: Payer: Self-pay | Admitting: Rheumatology

## 2021-02-22 VITALS — BP 150/87 | HR 64 | Ht 71.0 in | Wt 205.6 lb

## 2021-02-22 DIAGNOSIS — M19041 Primary osteoarthritis, right hand: Secondary | ICD-10-CM

## 2021-02-22 DIAGNOSIS — M25562 Pain in left knee: Secondary | ICD-10-CM | POA: Diagnosis not present

## 2021-02-22 DIAGNOSIS — Z981 Arthrodesis status: Secondary | ICD-10-CM

## 2021-02-22 DIAGNOSIS — G8929 Other chronic pain: Secondary | ICD-10-CM

## 2021-02-22 DIAGNOSIS — F32A Depression, unspecified: Secondary | ICD-10-CM

## 2021-02-22 DIAGNOSIS — M19071 Primary osteoarthritis, right ankle and foot: Secondary | ICD-10-CM | POA: Diagnosis not present

## 2021-02-22 DIAGNOSIS — I10 Essential (primary) hypertension: Secondary | ICD-10-CM

## 2021-02-22 DIAGNOSIS — M65312 Trigger thumb, left thumb: Secondary | ICD-10-CM | POA: Diagnosis not present

## 2021-02-22 DIAGNOSIS — F419 Anxiety disorder, unspecified: Secondary | ICD-10-CM

## 2021-02-22 DIAGNOSIS — M19042 Primary osteoarthritis, left hand: Secondary | ICD-10-CM

## 2021-02-22 DIAGNOSIS — M19072 Primary osteoarthritis, left ankle and foot: Secondary | ICD-10-CM

## 2021-02-22 DIAGNOSIS — Z13820 Encounter for screening for osteoporosis: Secondary | ICD-10-CM

## 2021-02-22 DIAGNOSIS — M436 Torticollis: Secondary | ICD-10-CM

## 2021-02-22 DIAGNOSIS — Z8639 Personal history of other endocrine, nutritional and metabolic disease: Secondary | ICD-10-CM

## 2021-02-22 NOTE — Patient Instructions (Signed)
Knee Exercises Ask your health care provider which exercises are safe for you. Do exercises exactly as told by your health care provider and adjust them as directed. It is normal to feel mild stretching, pulling, tightness, or discomfort as you do these exercises. Stop right away if you feel sudden pain or your pain gets worse. Do not begin these exercises until told by your health care provider. Stretching and range-of-motion exercises These exercises warm up your muscles and joints and improve the movement and flexibility of your knee. These exercises also help to relieve pain and swelling. Knee extension, prone Lie on your abdomen (prone position) on a bed. Place your left / right knee just beyond the edge of the surface so your knee is not on the bed. You can put a towel under your left / right thigh just above your kneecap for comfort. Relax your leg muscles and allow gravity to straighten your knee (extension). You should feel a stretch behind your left / right knee. Hold this position for __________ seconds. Scoot up so your knee is supported between repetitions. Repeat __________ times. Complete this exercise __________ times a day. Knee flexion, active  Lie on your back with both legs straight. If this causes back discomfort, bend your left / right knee so your foot is flat on the floor. Slowly slide your left / right heel back toward your buttocks. Stop when you feel a gentle stretch in the front of your knee or thigh (flexion). Hold this position for __________ seconds. Slowly slide your left / right heel back to the starting position. Repeat __________ times. Complete this exercise __________ times a day. Quadriceps stretch, prone  Lie on your abdomen on a firm surface, such as a bed or padded floor. Bend your left / right knee and hold your ankle. If you cannot reach your ankle or pant leg, loop a belt around your foot and grab the belt instead. Gently pull your heel toward your  buttocks. Your knee should not slide out to the side. You should feel a stretch in the front of your thigh and knee (quadriceps). Hold this position for __________ seconds. Repeat __________ times. Complete this exercise __________ times a day. Hamstring, supine Lie on your back (supine position). Loop a belt or towel over the ball of your left / right foot. The ball of your foot is on the walking surface, right under your toes. Straighten your left / right knee and slowly pull on the belt to raise your leg until you feel a gentle stretch behind your knee (hamstring). Do not let your knee bend while you do this. Keep your other leg flat on the floor. Hold this position for __________ seconds. Repeat __________ times. Complete this exercise __________ times a day. Strengthening exercises These exercises build strength and endurance in your knee. Endurance is the ability to use your muscles for a long time, even after they get tired. Quadriceps, isometric This exercise stretches the muscles in front of your thigh (quadriceps) without moving your knee joint (isometric). Lie on your back with your left / right leg extended and your other knee bent. Put a rolled towel or small pillow under your knee if told by your health care provider. Slowly tense the muscles in the front of your left / right thigh. You should see your kneecap slide up toward your hip or see increased dimpling just above the knee. This motion will push the back of the knee toward the floor. For __________   seconds, hold the muscle as tight as you can without increasing your pain. Relax the muscles slowly and completely. Repeat __________ times. Complete this exercise __________ times a day. Straight leg raises This exercise stretches the muscles in front of your thigh (quadriceps) and the muscles that move your hips (hip flexors). Lie on your back with your left / right leg extended and your other knee bent. Tense the muscles in  the front of your left / right thigh. You should see your kneecap slide up or see increased dimpling just above the knee. Your thigh may even shake a bit. Keep these muscles tight as you raise your leg 4-6 inches (10-15 cm) off the floor. Do not let your knee bend. Hold this position for __________ seconds. Keep these muscles tense as you lower your leg. Relax your muscles slowly and completely after each repetition. Repeat __________ times. Complete this exercise __________ times a day. Hamstring, isometric Lie on your back on a firm surface. Bend your left / right knee about __________ degrees. Dig your left / right heel into the surface as if you are trying to pull it toward your buttocks. Tighten the muscles in the back of your thighs (hamstring) to "dig" as hard as you can without increasing any pain. Hold this position for __________ seconds. Release the tension gradually and allow your muscles to relax completely for __________ seconds after each repetition. Repeat __________ times. Complete this exercise __________ times a day. Hamstring curls If told by your health care provider, do this exercise while wearing ankle weights. Begin with __________ lb weights. Then increase the weight by 1 lb (0.5 kg) increments. Do not wear ankle weights that are more than __________ lb. Lie on your abdomen with your legs straight. Bend your left / right knee as far as you can without feeling pain. Keep your hips flat against the floor. Hold this position for __________ seconds. Slowly lower your leg to the starting position. Repeat __________ times. Complete this exercise __________ times a day. Squats This exercise strengthens the muscles in front of your thigh and knee (quadriceps). Stand in front of a table, with your feet and knees pointing straight ahead. You may rest your hands on the table for balance but not for support. Slowly bend your knees and lower your hips like you are going to sit in a  chair. Keep your weight over your heels, not over your toes. Keep your lower legs upright so they are parallel with the table legs. Do not let your hips go lower than your knees. Do not bend lower than told by your health care provider. If your knee pain increases, do not bend as low. Hold the squat position for __________ seconds. Slowly push with your legs to return to standing. Do not use your hands to pull yourself to standing. Repeat __________ times. Complete this exercise __________ times a day. Wall slides This exercise strengthens the muscles in front of your thigh and knee (quadriceps). Lean your back against a smooth wall or door, and walk your feet out 18-24 inches (46-61 cm) from it. Place your feet hip-width apart. Slowly slide down the wall or door until your knees bend __________ degrees. Keep your knees over your heels, not over your toes. Keep your knees in line with your hips. Hold this position for __________ seconds. Repeat __________ times. Complete this exercise __________ times a day. Straight leg raises This exercise strengthens the muscles that rotate the leg at the hip and   move it away from your body (hip abductors). Lie on your side with your left / right leg in the top position. Lie so your head, shoulder, knee, and hip line up. You may bend your bottom knee to help you keep your balance. Roll your hips slightly forward so your hips are stacked directly over each other and your left / right knee is facing forward. Leading with your heel, lift your top leg 4-6 inches (10-15 cm). You should feel the muscles in your outer hip lifting. Do not let your foot drift forward. Do not let your knee roll toward the ceiling. Hold this position for __________ seconds. Slowly return your leg to the starting position. Let your muscles relax completely after each repetition. Repeat __________ times. Complete this exercise __________ times a day. Straight leg raises This  exercise stretches the muscles that move your hips away from the front of the pelvis (hip extensors). Lie on your abdomen on a firm surface. You can put a pillow under your hips if that is more comfortable. Tense the muscles in your buttocks and lift your left / right leg about 4-6 inches (10-15 cm). Keep your knee straight as you lift your leg. Hold this position for __________ seconds. Slowly lower your leg to the starting position. Let your leg relax completely after each repetition. Repeat __________ times. Complete this exercise __________ times a day. This information is not intended to replace advice given to you by your health care provider. Make sure you discuss any questions you have with your health care provider. Document Revised: 02/10/2018 Document Reviewed: 02/10/2018 Else[employer] Patient Education  2022 [employer] Back Exercises The following exercises strengthen the muscles that help to support the trunk (torso) and back. They also help to keep the lower back flexible. Doing these exercises can help to prevent or lessen existing low back pain. If you have back pain or discomfort, try doing these exercises 2-3 times each day or as told by your health care provider. As your pain improves, do them once each day, but increase the number of times that you repeat the steps for each exercise (do more repetitions). To prevent the recurrence of back pain, continue to do these exercises once each day or as told by your health care provider. Do exercises exactly as told by your health care provider and adjust them as directed. It is normal to feel mild stretching, pulling, tightness, or discomfort as you do these exercises, but you should stop right away if you feel sudden pain or your pain gets worse. Exercises Single knee to chest Repeat these steps 3-5 times for each leg: Lie on your back on a firm bed or the floor with your legs extended. Bring one knee to your chest. Your other leg  should stay extended and in contact with the floor. Hold your knee in place by grabbing your knee or thigh with both hands and hold. Pull on your knee until you feel a gentle stretch in your lower back or buttocks. Hold the stretch for 10-30 seconds. Slowly release and straighten your leg. Pelvic tilt Repeat these steps 5-10 times: Lie on your back on a firm bed or the floor with your legs extended. Bend your knees so they are pointing toward the ceiling and your feet are flat on the floor. Tighten your lower abdominal muscles to press your lower back against the floor. This motion will tilt your pelvis so your tailbone points up toward the ceiling instead of pointing   to your feet or the floor. With gentle tension and even breathing, hold this position for 5-10 seconds. Cat-cow Repeat these steps until your lower back becomes more flexible: Get into a hands-and-knees position on a firm bed or the floor. Keep your hands under your shoulders, and keep your knees under your hips. You may place padding under your knees for comfort. Let your head hang down toward your chest. Contract your abdominal muscles and point your tailbone toward the floor so your lower back becomes rounded like the back of a cat. Hold this position for 5 seconds. Slowly lift your head, let your abdominal muscles relax, and point your tailbone up toward the ceiling so your back forms a sagging arch like the back of a cow. Hold this position for 5 seconds.  Press-ups Repeat these steps 5-10 times: Lie on your abdomen (face-down) on a firm bed or the floor. Place your palms near your head, about shoulder-width apart. Keeping your back as relaxed as possible and keeping your hips on the floor, slowly straighten your arms to raise the top half of your body and lift your shoulders. Do not use your back muscles to raise your upper torso. You may adjust the placement of your hands to make yourself more comfortable. Hold this  position for 5 seconds while you keep your back relaxed. Slowly return to lying flat on the floor.  Bridges Repeat these steps 10 times: Lie on your back on a firm bed or the floor. Bend your knees so they are pointing toward the ceiling and your feet are flat on the floor. Your arms should be flat at your sides, next to your body. Tighten your buttocks muscles and lift your buttocks off the floor until your waist is at almost the same height as your knees. You should feel the muscles working in your buttocks and the back of your thighs. If you do not feel these muscles, slide your feet 1-2 inches (2.5-5 cm) farther away from your buttocks. Hold this position for 3-5 seconds. Slowly lower your hips to the starting position, and allow your buttocks muscles to relax completely. If this exercise is too easy, try doing it with your arms crossed over your chest. Abdominal crunches Repeat these steps 5-10 times: Lie on your back on a firm bed or the floor with your legs extended. Bend your knees so they are pointing toward the ceiling and your feet are flat on the floor. Cross your arms over your chest. Tip your chin slightly toward your chest without bending your neck. Tighten your abdominal muscles and slowly raise your torso high enough to lift your shoulder blades a tiny bit off the floor. Avoid raising your torso higher than that because it can put too much stress on your lower back and does not help to strengthen your abdominal muscles. Slowly return to your starting position. Back lifts Repeat these steps 5-10 times: Lie on your abdomen (face-down) with your arms at your sides, and rest your forehead on the floor. Tighten the muscles in your legs and your buttocks. Slowly lift your chest off the floor while you keep your hips pressed to the floor. Keep the back of your head in line with the curve in your back. Your eyes should be looking at the floor. Hold this position for 3-5  seconds. Slowly return to your starting position. Contact a health care provider if: Your back pain or discomfort gets much worse when you do an exercise. Your worsening   back pain or discomfort does not lessen within 2 hours after you exercise. If you have any of these problems, stop doing these exercises right away. Do not do them again unless your health care provider says that you can. Get help right away if: You develop sudden, severe back pain. If this happens, stop doing the exercises right away. Do not do them again unless your health care provider says that you can. This information is not intended to replace advice given to you by your health care provider. Make sure you discuss any questions you have with your health care provider. Document Revised: 07/05/2020 Document Reviewed: 07/05/2020 Elsevier Patient Education  2022 [employer] Inc[employer]  

## 2021-03-13 ENCOUNTER — Other Ambulatory Visit: Payer: Self-pay | Admitting: Cardiology

## 2021-03-13 DIAGNOSIS — I10 Essential (primary) hypertension: Secondary | ICD-10-CM

## 2021-03-13 DIAGNOSIS — R000 Tachycardia, unspecified: Secondary | ICD-10-CM

## 2021-04-09 ENCOUNTER — Other Ambulatory Visit: Payer: Self-pay

## 2021-04-09 DIAGNOSIS — R000 Tachycardia, unspecified: Secondary | ICD-10-CM

## 2021-04-09 DIAGNOSIS — I10 Essential (primary) hypertension: Secondary | ICD-10-CM

## 2021-04-09 MED ORDER — METOPROLOL SUCCINATE ER 50 MG PO TB24: ORAL_TABLET | ORAL | 1 refills | Status: DC

## 2021-05-08 ENCOUNTER — Ambulatory Visit: Payer: Self-pay

## 2021-05-08 ENCOUNTER — Ambulatory Visit: Payer: Medicare Other | Admitting: Rheumatology

## 2021-05-08 ENCOUNTER — Encounter: Payer: Self-pay | Admitting: Rheumatology

## 2021-05-08 ENCOUNTER — Telehealth: Payer: Self-pay

## 2021-05-08 ENCOUNTER — Other Ambulatory Visit: Payer: Self-pay

## 2021-05-08 VITALS — BP 145/83 | HR 81 | Ht 71.0 in | Wt 216.8 lb

## 2021-05-08 DIAGNOSIS — Z13820 Encounter for screening for osteoporosis: Secondary | ICD-10-CM

## 2021-05-08 DIAGNOSIS — M19041 Primary osteoarthritis, right hand: Secondary | ICD-10-CM

## 2021-05-08 DIAGNOSIS — M19072 Primary osteoarthritis, left ankle and foot: Secondary | ICD-10-CM

## 2021-05-08 DIAGNOSIS — F419 Anxiety disorder, unspecified: Secondary | ICD-10-CM

## 2021-05-08 DIAGNOSIS — M19071 Primary osteoarthritis, right ankle and foot: Secondary | ICD-10-CM | POA: Diagnosis not present

## 2021-05-08 DIAGNOSIS — M79645 Pain in left finger(s): Secondary | ICD-10-CM | POA: Diagnosis not present

## 2021-05-08 DIAGNOSIS — M65312 Trigger thumb, left thumb: Secondary | ICD-10-CM | POA: Diagnosis not present

## 2021-05-08 DIAGNOSIS — Z8639 Personal history of other endocrine, nutritional and metabolic disease: Secondary | ICD-10-CM

## 2021-05-08 DIAGNOSIS — Z981 Arthrodesis status: Secondary | ICD-10-CM

## 2021-05-08 DIAGNOSIS — F32A Depression, unspecified: Secondary | ICD-10-CM

## 2021-05-08 DIAGNOSIS — M19042 Primary osteoarthritis, left hand: Secondary | ICD-10-CM

## 2021-05-08 DIAGNOSIS — I10 Essential (primary) hypertension: Secondary | ICD-10-CM

## 2021-05-08 MED ORDER — TRIAMCINOLONE ACETONIDE 40 MG/ML IJ SUSP
10.0000 mg | INTRAMUSCULAR | Status: AC | PRN
  Administered 2021-05-08: 10 mg via INTRA_ARTICULAR

## 2021-05-08 MED ORDER — LIDOCAINE HCL 1 % IJ SOLN
0.5000 mL | INTRAMUSCULAR | Status: AC | PRN
  Administered 2021-05-08: .5 mL

## 2021-05-08 NOTE — Progress Notes (Signed)
Office Visit Note  Patient: Richard Williams             Date of Birth: 06/29/1940           MRN: [id number]             PCP: [name], MD Referring: [name], MD Visit Date: 05/08/2021 Occupation: @GUAROCC @  Subjective:  Pain in left index finger.   History of Present Illness: Richard Williams is a 81 y.o. male with a history of osteoarthritis.  He states for the last 4 to 6 months he has been having pain and discomfort in his left index finger which she describes over the PIP joint.  He states the pain is getting worse.  He has not seen any joint swelling.  None of the other joints are painful.  He had good response to left trigger thumb injection.  He has been using lidocaine patches for the lower back discomfort.  The lower back is doing better currently.  Activities of Daily Living:  Patient reports morning stiffness for a few minutes.   Patient Denies nocturnal pain.  Difficulty dressing/grooming: Denies Difficulty climbing stairs: Denies Difficulty getting out of chair: Denies Difficulty using hands for taps, buttons, cutlery, and/or writing: Denies  Review of Systems  Constitutional:  Positive for fatigue.  HENT:  Positive for mouth dryness. Negative for mouth sores and nose dryness.   Eyes:  Negative for pain, itching and dryness.  Respiratory:  Negative for difficulty breathing.   Cardiovascular:  Negative for chest pain and palpitations.  Gastrointestinal:  Negative for blood in stool, constipation and diarrhea.  Endocrine: Negative for increased urination.  Genitourinary:  Negative for difficulty urinating.  Musculoskeletal:  Positive for joint pain, joint pain and morning stiffness. Negative for joint swelling, myalgias, muscle tenderness and myalgias.  Skin:  Negative for color change, rash and redness.  Allergic/Immunologic: Negative for susceptible to infections.  Neurological:  Positive for numbness, headaches and memory loss. Negative for dizziness  and weakness.  Hematological:  Positive for bruising/bleeding tendency.  Psychiatric/Behavioral:  Negative for confusion.    PMFS History:  Patient Active Problem List   Diagnosis Date Noted   Primary osteoarthritis of both hands 02/01/2019   S/P lumbar spinal fusion 03/20/2017   Recurrent left inguinal hernia 11/13/2015   S/P lumbar laminectomy 02/10/2014    Past Medical History:  Diagnosis Date   Anxiety    Arthritis    BPH (benign prostatic hypertrophy)    on meds   Depression    Dysrhythmia    1 episode of AF but never came back   Enlarged aorta (HCC)    GERD (gastroesophageal reflux disease)    on meds   Hyperlipidemia    Hypertension    Neuromuscular disorder (HCC)     right groin with numbness states he had back surgery in hopes to correct this, but still there   Peripheral vascular disease ([facility])    surface blood clot x1 top of left foot   Pneumonia    years ago 55,    Family History  Problem Relation Age of Onset   Stroke Mother    Heart Problems Father    Macular degeneration Sister    Healthy Daughter    Past Surgical History:  Procedure Laterality Date   BACK SURGERY     EYE SURGERY Bilateral    cataracts   HERNIA REPAIR     INGUINAL HERNIA REPAIR Left 11/13/2015   Procedure: OPEN REPAIR LEFT  INGUINAL HERNIA;  Surgeon: Excell Seltzer, MD;  Location: [facility];  Service: General;  Laterality: Left;   LAMINECTOMY WITH POSTERIOR LATERAL ARTHRODESIS LEVEL 2 N/A 03/20/2017   Procedure: Posterior Lateral Fusion - L1 - L3, segmental fixation L1-3;  Surgeon: Eustace Moore, MD;  Location: [facility];  Service: Neurosurgery;  Laterality: N/A;   LUMBAR LAMINECTOMY/DECOMPRESSION MICRODISCECTOMY N/A 02/10/2014   Procedure: Lumbar One to Two, Lumbar Two to Three Lumbar laminectomy for stenosis/epidural mass;  Surgeon: Eustace Moore, MD;  Location: [facility];  Service: Neurosurgery;  Laterality: N/A;  L1-2 L2-3 Lumbar laminectomy for stenosis/epidural mass   TONSILLECTOMY      Social History   Social History Narrative   Not on file   Immunization History  Administered Date(s) Administered   Influenza, High Dose Seasonal PF 02/05/2019   Moderna Covid-19 Vaccine Bivalent Booster 52yrs & up 02/15/2021   Moderna Sars-Covid-2 Vaccination 05/18/2019, 06/28/2019, 02/28/2020, 11/10/2020   Zoster Recombinat (Shingrix) 07/16/2017     Objective: Vital Signs: BP (!) 145/83 (BP Location: Left Arm, Patient Position: Sitting, Cuff Size: Normal)    Pulse 81    Ht 5\' 11"  (1.803 m)    Wt 216 lb 12.8 oz (98.3 kg)    BMI 30.24 kg/m    Physical Exam Vitals and nursing note reviewed.  Constitutional:      Appearance: He is well-developed.  HENT:     Head: Normocephalic and atraumatic.  Eyes:     Conjunctiva/sclera: Conjunctivae normal.     Pupils: Pupils are equal, round, and reactive to light.  Cardiovascular:     Rate and Rhythm: Normal rate and regular rhythm.     Heart sounds: Normal heart sounds.  Pulmonary:     Effort: Pulmonary effort is normal.     Breath sounds: Normal breath sounds.  Abdominal:     General: Bowel sounds are normal.     Palpations: Abdomen is soft.  Musculoskeletal:     Cervical back: Normal range of motion and neck supple.  Skin:    General: Skin is warm and dry.     Capillary Refill: Capillary refill takes less than 2 seconds.  Neurological:     Mental Status: He is alert and oriented to person, place, and time.  Psychiatric:        Behavior: Behavior normal.     Musculoskeletal Exam: C-spine was in good range of motion.  He had thoracic kyphosis.  Shoulder joints, elbow joints, wrist joints with good range of motion.  He had bilateral PIP and DIP thickening with no synovitis.  Left trigger thumb resolved.  Hip joints and knee joints with good range of motion.  No tenderness over ankles or MTPs.  CDAI Exam: CDAI Score: -- Patient Global: --; Provider Global: -- Swollen: --; Tender: -- Joint Exam 05/08/2021   No joint exam has  been documented for this visit   There is currently no information documented on the homunculus. Go to the Rheumatology activity and complete the homunculus joint exam.  Investigation: No additional findings.  Imaging: US Guided Needle Placement  Result Date: 05/08/2021 Ultrasound guided injection is preferred based studies that show increased duration, increased effect, greater accuracy, decreased procedural pain, increased response rate, and decreased cost with ultrasound guided versus blind injection.   Verbal informed consent obtained.  Time-out conducted.  Noted no overlying erythema, induration, or other signs of local infection. Ultrasound-guided left second PIP injection: After sterile prep with Betadine, injected 0.5 mL of 1% lidocaine and  10 mg Kenalog using a 27-gauge needle, needle was visualized in the right second PIP joint prior to the injection.    XR Finger Index Left  Result Date: 05/08/2021 PIP and DIP narrowing was noted.  DIP spur was noted.  No MCP narrowing was noted.  No fracture was noted. Impression: These findings are consistent with osteoarthritis.   Recent Labs: Lab Results  Component Value Date   WBC 9.0 03/11/2017   HGB 14.0 03/11/2017   PLT 183 03/11/2017   NA 138 03/11/2017   K 4.0 03/11/2017   CL 106 03/11/2017   CO2 24 03/11/2017   GLUCOSE 125 (H) 03/11/2017   BUN 24 (H) 03/11/2017   CREATININE 1.29 (H) 03/11/2017   CALCIUM 9.2 03/11/2017   GFRAA >60 03/11/2017    Speciality Comments: No specialty comments available.  Procedures:  Small Joint Inj: L index PIP on 05/08/2021 3:56 PM Indications: pain Details: 27 G needle, ultrasound-guided dorsal approach  Spinal Needle: No  Medications: 0.5 mL lidocaine 1 %; 10 mg triamcinolone acetonide 40 MG/ML Aspirate: 0 mL Outcome: tolerated well, no immediate complications Procedure, treatment alternatives, risks and benefits explained, specific risks discussed. Consent was given by the patient.  Immediately prior to procedure a time out was called to verify the correct patient, procedure, equipment, support staff and site/side marked as required. Patient was prepped and draped in the usual sterile fashion.    Allergies: Codeine, Other, Lorazepam, and Amoxicillin   Assessment / Plan:     Visit Diagnoses: Finger pain, left -he has been experiencing pain and discomfort in his left index finger for the last few months.  He had no synovitis on examination.  He had mild tenderness over the left PIP joint.  X-ray obtained of the left index finger was consistent with osteoarthritis.  Per patient's request after informed consent was obtained the left second PIP joint was injected with lidocaine and cortisone as described above.  He tolerated the procedure well.  Postprocedure instructions were given.  Plan: US Guided Needle Placement, XR Finger Index Left  Primary osteoarthritis of both hands-he has bilateral PIP and DIP thickening with no synovitis.  Trigger thumb, left thumb - Injected on December 01, 2018.  Resolved after the cortisone injection.  Primary osteoarthritis of both feet-he denies any discomfort.  S/P lumbar spinal fusion - history of lumbar spine fusion x2.  He has off-and-on discomfort.  Essential hypertension-blood pressure was mildly elevated.  Anxiety and depression  History of hyperlipidemia  Osteoporosis screening -  DEXA 08/15/2020 T-score: 0.2, BMD: 0.956 left femoral neck.  Orders: Orders Placed This Encounter  Procedures   Small Joint Inj   US Guided Needle Placement   XR Finger Index Left   No orders of the defined types were placed in this encounter.    Follow-Up Instructions: Return for Osteoarthritis.   Bo Merino, MD  Note - This record has been created using Editor, commissioning.  Chart creation errors have been sought, but may not always  have been located. Such creation errors do not reflect on  the standard of medical care.

## 2021-05-08 NOTE — Telephone Encounter (Signed)
Spoke with patient. He states it is his left index finger. Patient scheduled for 05/08/2021 at 2:30 pm.

## 2021-05-08 NOTE — Telephone Encounter (Signed)
Patient left a voicemail stating he needs a cortisone injection for his finger.  Please advise.

## 2021-08-07 ENCOUNTER — Ambulatory Visit: Payer: Medicare Other

## 2021-08-07 DIAGNOSIS — I77810 Thoracic aortic ectasia: Secondary | ICD-10-CM

## 2021-08-16 ENCOUNTER — Ambulatory Visit: Payer: Medicare Other | Admitting: Cardiology

## 2021-08-16 ENCOUNTER — Encounter: Payer: Self-pay | Admitting: Cardiology

## 2021-08-16 VITALS — BP 134/79 | HR 103 | Temp 98.0°F | Resp 16 | Ht 71.0 in | Wt 212.0 lb

## 2021-08-16 DIAGNOSIS — R2991 Unspecified symptoms and signs involving the musculoskeletal system: Secondary | ICD-10-CM

## 2021-08-16 DIAGNOSIS — I77810 Thoracic aortic ectasia: Secondary | ICD-10-CM

## 2021-08-16 DIAGNOSIS — I10 Essential (primary) hypertension: Secondary | ICD-10-CM

## 2021-08-16 DIAGNOSIS — R000 Tachycardia, unspecified: Secondary | ICD-10-CM

## 2021-08-16 MED ORDER — LISINOPRIL 20 MG PO TABS: 20.0000 mg | ORAL_TABLET | Freq: Every day | ORAL | 3 refills | Status: DC

## 2021-08-16 NOTE — Progress Notes (Signed)
? ?Primary Physician/Referring:  [name], MD ? ?Patient ID: Richard Williams, male    DOB: [birth year], 81 y.o.   MRN: [id number] ? ?Chief Complaint  ?Patient presents with  ?? Sinus tachycardia  ?? Hypertension  ?? aortic root dilatation  ?? Follow-up  ?  1 year  ? ?HPI:   ? ?Richard Williams  is a 81 y.o. Caucasian male with history of hypertension, mild aortic root dilatation, mild pectus excavatum, scoliosis, chronic dyspnea on exertion, GERD, lumbar disc disease.  Patient also has chronic elevated heart rate (sinus tachycardia). ? ?This is his annual visit.  He is presently asymptomatic.   ? ?Past Medical History:  ?Diagnosis Date  ?? Anxiety   ?? Arthritis   ?? BPH (benign prostatic hypertrophy)   ? on meds  ?? Depression   ?? Dysrhythmia   ? 1 episode of AF but never came back  ?? Enlarged aorta (HCC)   ?? GERD (gastroesophageal reflux disease)   ? on meds  ?? Hyperlipidemia   ?? Hypertension   ?? Neuromuscular disorder ([place])   ?  right groin with numbness states he had back surgery in hopes to correct this, but still there  ?? Peripheral vascular disease ([place])   ? surface blood clot x1 top of left foot  ?? Pneumonia   ? years ago 5,  ? ? ?Family History  ?Problem Relation Age of Onset  ?? Stroke Mother   ?? Heart Problems Father   ?? Macular degeneration Sister   ?? Healthy Daughter   ? ?Social History  ? ?Tobacco Use  ?? Smoking status: Never  ?? Smokeless tobacco: Never  ?Substance Use Topics  ?? Alcohol use: Yes  ?  Comment: once or twice weekly  ? ?Marital Status: Married ? ?ROS  ?Review of Systems  ?Cardiovascular:  Negative for chest pain, dyspnea on exertion and leg swelling.  ?Objective  ? ? ?  08/16/2021  ? 11:00 AM 05/08/2021  ?  2:42 PM 02/22/2021  ?  2:26 PM  ?Vitals with BMI  ?Height 5' 11"  5' 11"  5' 11"   ?Weight 212 lbs 216 lbs 13 oz 205 lbs 10 oz  ?BMI 29.58 30.25 28.69  ?Systolic 283 151 761  ?Diastolic 79 83 87  ?Pulse 103 81 64  ?  ?  ?Physical Exam ?Neck:  ?   Vascular: No JVD.   ?Cardiovascular:  ?   Rate and Rhythm: Normal rate and regular rhythm.  ?   Pulses: Intact distal pulses.  ?   Heart sounds: Normal heart sounds. No murmur heard. ?  No gallop.  ?Pulmonary:  ?   Effort: Pulmonary effort is normal.  ?   Breath sounds: Normal breath sounds.  ?Abdominal:  ?   General: Bowel sounds are normal.  ?   Palpations: Abdomen is soft.  ?Musculoskeletal:  ?   Right lower leg: No edema.  ?   Left lower leg: No edema.  ? ?Laboratory examination:  ? ? ?  Latest Ref Rng & Units 03/11/2017  ?  3:52 PM 10/31/2015  ? 12:24 PM 02/03/2014  ? 10:51 AM  ?CMP  ?Glucose 65 - 99 mg/dL 125   106   103    ?BUN 6 - 20 mg/dL 24   27   30     ?Creatinine 0.61 - 1.24 mg/dL 1.29   1.42   1.19    ?Sodium 135 - 145 mmol/L 138   136   142    ?Potassium 3.5 -  5.1 mmol/L 4.0   4.2   4.5    ?Chloride 101 - 111 mmol/L 106   104   104    ?CO2 22 - 32 mmol/L 24   27   26     ?Calcium 8.9 - 10.3 mg/dL 9.2   9.6   9.1    ? ? ?  Latest Ref Rng & Units 03/11/2017  ?  3:52 PM 10/31/2015  ? 12:24 PM 02/03/2014  ? 10:51 AM  ?CBC  ?WBC 4.0 - 10.5 K/uL 9.0   7.7   6.2    ?Hemoglobin 13.0 - 17.0 g/dL 14.0   14.2   14.3    ?Hematocrit 39.0 - 52.0 % 40.5   41.8   40.7    ?Platelets 150 - 400 K/uL 183   180   173    ? ?Lipid Panel  ?No results found for: CHOL, TRIG, HDL, CHOLHDL, VLDL, LDLCALC, LDLDIRECT ?HEMOGLOBIN A1C ?No results found for: HGBA1C, MPG ?TSH ?No results for input(s): TSH in the last 8760 hours. ? ?External labs:  ? ?05/26/2019: ?Total cholesterol 134, triglycerides 86, HDL 43, LDL 74, non-HDL 91 ?Hemoglobin 14.2, hematocrit 40.4, MCV 94.6, platelets 212 9 sodium 134, potassium 4.2, glucose 89, BUN 16, creatinine 1.12, GFR 63 ?A1c 5.3% ?TSH 1.58 ? ?05/20/2018:  ?CBC normal, PSA normal: Total cholesterol 130, triglycerides 79, HDL 49, LDL 65. ?Serum glucose 86 mg, BUN 20, creatinine 1.48, EGFR 28 mL, potassium 4.1.  CMP otherwise normal.  ? ?03/03/2014:  ?Total cholesterol 162, triglycerides 92, HDL 46, LDL 98. ? ?Medications and  allergies  ? ?Allergies  ?Allergen Reactions  ?? Codeine Hives  ?? Other Itching and Other (See Comments)  ?  OPIODS--All over itch  ?Including Laudanum  ?? Lorazepam   ?  Pt stated, "I had a side effect -- I could not control my urination"  ?? Amoxicillin Other (See Comments)  ?  Cold sores  ?Has patient had a PCN reaction causing immediate rash, facial/tongue/throat swelling, SOB or lightheadedness with hypotension: No ?Has patient had a PCN reaction causing severe rash involving mucus membranes or skin necrosis: Yes ?Has patient had a PCN reaction that required hospitalization: No ?Has patient had a PCN reaction occurring within the last 10 years: No ?If all of the above answers are "NO", then may proceed with Cephalosporin use. ?  ?  ?Current Outpatient Medications on File Prior to Visit  ?Medication Sig Dispense Refill  ?? Cholecalciferol (VITAMIN D3) 25 MCG (1000 UT) CAPS Take 1 capsule by mouth daily.    ?? esomeprazole (NEXIUM) 40 MG capsule Take 40 mg by mouth daily.    ?? ezetimibe (ZETIA) 10 MG tablet Take 10 mg by mouth. M, W, Friday only    ?? fluticasone (FLONASE) 50 MCG/ACT nasal spray Place 1 spray into both nostrils daily after supper.    ?? loratadine (CLARITIN) 10 MG tablet Take 10 mg by mouth daily.    ?? metoprolol succinate (TOPROL-XL) 50 MG 24 hr tablet TAKE 1 TABLET BY MOUTH ONCE DAILY TAKE  WITH  OR  IMMEDIATELY  FOLLOWING  A  MEAL  (DISCONTINUE  DILTIAZEM-EDEMA) 90 tablet 1  ?? MIRALAX 17 GM/SCOOP powder as needed.    ?? NON FORMULARY ALTERIL Sleep Aid    ?? NUCYNTA 50 MG tablet Take 50 mg by mouth as needed.    ?? Ophthalmic Irrigation Solution (OCUSOFT EYE [place] OP) Apply 1 application to eye at bedtime.    ?? Pyridoxine HCl (VITAMIN B-6 PO)  Take by mouth daily.    ?? terazosin (HYTRIN) 5 MG capsule Take 10 mg by mouth at bedtime.    ?? vitamin C (ASCORBIC ACID) 500 MG tablet SMARTSIG:1 By Mouth    ? ?No current facility-administered medications on file prior to visit.  ?  ? ?Radiology:   ?No results found. ?Cardiac Studies:  ? ?Treadmill exercise stress test 12/07/2014: Patient exercised for 9 minutes in Bruce stage III, achieving 10 METS workload.  There was no evidence of ischemia.  Normal blood pressure response.  Achieved 114% of MPHR. ? ?PCV ECHOCARDIOGRAM COMPLETE 08/07/2021 : ?Left ventricle cavity is normal in size. Moderate concentric hypertrophy of the left ventricle. 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 Normal LV systolic function with EF 58%. Doppler evidence of grade I (impaired) diastolic dysfunction, normal LAP. ?Mild (Grade I) mitral regurgitation. ?Mild tricuspid regurgitation. ?Mild pulmonic regurgitation. ?The aortic root is dilated at 4.1 cm. ?IVC is not seen. ?Compared to previous study on 08/03/2020, mild MR and PI are new. No other significant change noted.  ? ?EKG  ? ?EKG 08/16/2021: Normal sinus rhythm at rate of 90 bpm, left atrial enlargement, left axis deviation, left anterior fascicular block.  LVH. ? ?Assessment  ? ?  ICD-10-CM   ?1. Sinus tachycardia  R00.0 EKG 12-Lead  ?  ?2. Aortic root dilatation (HCC)  I77.810 lisinopril (ZESTRIL) 20 MG tablet  ?  PCV ECHOCARDIOGRAM COMPLETE  ?  ?3. Primary hypertension  I10 lisinopril (ZESTRIL) 20 MG tablet  ?  Basic metabolic panel  ?  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  ?  ?Meds ordered this encounter  ?Medications  ?? lisinopril (ZESTRIL) 20 MG tablet  ?  Sig: Take 1 tablet (20 mg total) by mouth daily.  ?  Dispense:  90 tablet  ?  Refill:  3  ? ?Medications Discontinued During This Encounter  ?Medication Reason  ?? lisinopril (ZESTRIL) 5 MG tablet   ?? DULoxetine (CYMBALTA) 30 MG capsule   ?? azelastine (ASTELIN) 0.1 % nasal spray   ?? lisinopril (ZESTRIL) 10 MG tablet Reorder  ? ?Recommendations:  ? ?Richard Williams  is a 81 y.o. Caucasian male with history of hypertension, mild aortic root dilatation, mild pectus excavatum, scoliosis, chronic dyspnea on exertion, GERD, lumbar disc disease.  Patient also has chronic elevated  heart rate (sinus tachycardia). ? ?This is his annual visit.  He is presently asymptomatic.  No change in his physical exam.  I reviewed the results of the echocardiogram, aortic root size has remained stable.  O

## 2021-08-16 NOTE — Patient Instructions (Signed)
Please get blood work done in about 3 to 4 weeks after increasing the dose of the lisinopril to 20 mg daily. ? ?He can look up any [facility] locations close to you and just show up.  No fasting is necessary. ?

## 2021-10-02 ENCOUNTER — Other Ambulatory Visit: Payer: Self-pay | Admitting: Cardiology

## 2021-10-02 DIAGNOSIS — R000 Tachycardia, unspecified: Secondary | ICD-10-CM

## 2021-10-02 DIAGNOSIS — I10 Essential (primary) hypertension: Secondary | ICD-10-CM

## 2022-02-07 NOTE — Progress Notes (Signed)
Office Visit Note  Patient: Richard Williams             Date of Birth: Aug 21, 1940           MRN: [id number]             PCP: [name], MD Referring: [name], MD Visit Date: 02/21/2022 Occupation: @GUAROCC @  Subjective:  Pain in right knee  History of Present Illness: HAZIM TREADWAY is a 81 y.o. male with history of osteoarthritis and degenerative disc disease.  He states that he has been biking at home and 1 day after biking he started having pain and discomfort in his right knee joint.  He describes pain below his right knee.  The symptoms are gradually getting better.  He also has noticed that when he is driving his right hand gets numb.  Left trigger thumb has resolved.  He has intermittent discomfort in his hands but no joint swelling.  He continues to have some lower back discomfort.  His blood pressure was recently elevated and he was switched to losartan/HCTZ by Dr. Einar Gip.  Activities of Daily Living:  Patient reports morning stiffness for less than 30 minutes.   Patient Denies nocturnal pain.  Difficulty dressing/grooming: Denies Difficulty climbing stairs: Denies Difficulty getting out of chair: Denies Difficulty using hands for taps, buttons, cutlery, and/or writing: Denies  Review of Systems  Constitutional:  Negative for fatigue.  HENT:  Negative for mouth sores and mouth dryness.   Eyes:  Negative for dryness.  Respiratory:  Positive for shortness of breath.   Cardiovascular:  Negative for chest pain and palpitations.  Gastrointestinal:  Negative for blood in stool, constipation and diarrhea.  Endocrine: Negative for increased urination.  Genitourinary:  Negative for involuntary urination.  Musculoskeletal:  Positive for morning stiffness. Negative for joint pain, gait problem, joint pain, joint swelling, myalgias, muscle weakness, muscle tenderness and myalgias.  Skin:  Positive for sensitivity to sunlight. Negative for color change, rash and hair loss.   Allergic/Immunologic: Negative for susceptible to infections.  Neurological:  Positive for headaches. Negative for dizziness.  Hematological:  Negative for swollen glands.  Psychiatric/Behavioral:  Negative for depressed mood and sleep disturbance. The patient is not nervous/anxious.     PMFS History:  Patient Active Problem List   Diagnosis Date Noted   Primary osteoarthritis of both hands 02/01/2019   S/P lumbar spinal fusion 03/20/2017   Recurrent left inguinal hernia 11/13/2015   S/P lumbar laminectomy 02/10/2014    Past Medical History:  Diagnosis Date   Anxiety    Arthritis    BPH (benign prostatic hypertrophy)    on meds   Depression    Dysrhythmia    1 episode of AF but never came back   Enlarged aorta (HCC)    GERD (gastroesophageal reflux disease)    on meds   Hyperlipidemia    Hypertension    Neuromuscular disorder (HCC)     right groin with numbness states he had back surgery in hopes to correct this, but still there   Peripheral vascular disease ([place])    surface blood clot x1 top of left foot   Pneumonia    years ago 43,    Family History  Problem Relation Age of Onset   Stroke Mother    Heart Problems Father    Macular degeneration Sister    Healthy Daughter    Past Surgical History:  Procedure Laterality Date   BACK SURGERY     EYE  SURGERY Bilateral    cataracts   HERNIA REPAIR     INGUINAL HERNIA REPAIR Left 11/13/2015   Procedure: OPEN REPAIR LEFT INGUINAL HERNIA;  Surgeon: Glenna Fellows, MD;  Location: [facility] OR;  Service: General;  Laterality: Left;   LAMINECTOMY WITH POSTERIOR LATERAL ARTHRODESIS LEVEL 2 N/A 03/20/2017   Procedure: Posterior Lateral Fusion - L1 - L3, segmental fixation L1-3;  Surgeon: Tia Alert, MD;  Location: [facility] OR;  Service: Neurosurgery;  Laterality: N/A;   LUMBAR LAMINECTOMY/DECOMPRESSION MICRODISCECTOMY N/A 02/10/2014   Procedure: Lumbar One to Two, Lumbar Two to Three Lumbar laminectomy for stenosis/epidural mass;   Surgeon: Tia Alert, MD;  Location: [facility];  Service: Neurosurgery;  Laterality: N/A;  L1-2 L2-3 Lumbar laminectomy for stenosis/epidural mass   TONSILLECTOMY     Social History   Social History Narrative   Not on file   Immunization History  Administered Date(s) Administered   Influenza, High Dose Seasonal PF 02/05/2019   Moderna Covid-19 Vaccine Bivalent Booster 43yrs & up 02/15/2021   Moderna Sars-Covid-2 Vaccination 05/18/2019, 06/28/2019, 02/28/2020, 11/10/2020   Zoster Recombinat (Shingrix) 07/16/2017     Objective: Vital Signs: BP 121/75 (BP Location: Left Arm, Patient Position: Sitting, Cuff Size: Normal)   Pulse 94   Resp 18   Ht 5\' 11"  (1.803 m)   Wt 218 lb 3.2 oz (99 kg)   BMI 30.43 kg/m    Physical Exam   Musculoskeletal Exam: Cervical spine was in good range of motion.  He has thoracic kyphosis.  Shoulder joints, elbow joints, wrist joints, MCPs PIPs and DIPs been good range of motion.  He had bilateral PIP and DIP thickening.  Hip joints and knee joints were in good range of motion with no tenderness on palpation today.  He had no tenderness over ankles or MTPs.  CDAI Exam: CDAI Score: -- Patient Global: --; Provider Global: -- Swollen: --; Tender: -- Joint Exam 02/21/2022   No joint exam has been documented for this visit   There is currently no information documented on the homunculus. Go to the Rheumatology activity and complete the homunculus joint exam.  Investigation: No additional findings.  Imaging: PCV MYOCARDIAL PERFUSION WITH LEXISCAN  Result Date: 02/18/2022 02/20/2022 (with Mod Bruce protocol) Nuclear stress test 02/18/2022: Myocardial perfusion is normal. There was diaphragmatic attenuation noted in the inferior wall. Overall LV systolic function is normal without regional wall motion abnormalities. Stress LV EF: 61%. Normal ECG stress. The heart rate response was consistent with Regadenoson. Patient achieved 87% of MPHR. No chest pain.  No previous exam available for comparison. Low risk.   DG Chest 2 View  Result Date: 02/13/2022 CLINICAL DATA:  Shortness of breath. Dyspnea on exertion. Abnormal breath sounds. EXAM: CHEST - 2 VIEW COMPARISON:  Chest x-ray 08/27/2017. FINDINGS: Mediastinum and hilar structures normal. Stable cardiomegaly. No pulmonary venous congestion. Low lung volumes. Mild left base infiltrate cannot be excluded. Mild pleuroparenchymal thickening consistent with scarring. No pleural effusion or pneumothorax. Probable hiatal hernia. Degenerative change and scoliosis thoracolumbar spine. Postsurgical changes lumbar spine. IMPRESSION: 1. Stable cardiomegaly. No pulmonary venous congestion. 2. Low lung volumes. Mild left base infiltrate cannot be excluded. 3. Probable hiatal hernia. Electronically Signed   By: 08/29/2017  Register M.D.   On: 02/13/2022 08:59    Recent Labs: Lab Results  Component Value Date   WBC 9.0 03/11/2017   HGB 14.0 03/11/2017   PLT 183 03/11/2017   NA 138 03/11/2017   K 4.0 03/11/2017   CL  106 03/11/2017   CO2 24 03/11/2017   GLUCOSE 125 (H) 03/11/2017   BUN 24 (H) 03/11/2017   CREATININE 1.29 (H) 03/11/2017   CALCIUM 9.2 03/11/2017   GFRAA >60 03/11/2017    Speciality Comments: No specialty comments available.  Procedures:  No procedures performed Allergies: Codeine, Other, Lorazepam, and Amoxicillin   Assessment / Plan:     Visit Diagnoses: Primary osteoarthritis of both hands-he has bilateral PIP and DIP thickening.  He has had intermittent discomfort in his hands.  Joint protection muscle strengthening was discussed.  A handout on hand exercises was given.  Carpal tunnel syndrome, right upper limb-he has been experiencing numbness in his right hand especially when he is driving and doing certain activities.  Counseling regarding carpal tunnel syndrome was provided.  A handout was given with information.  I offered a prescription for carpal tunnel brace.  He would like to wait  at this time.  Trigger thumb, left thumb - Injected on December 01, 2018.  Resolved after the cortisone injection.  Acute pain of right knee-he developed discomfort in his right knee joint after biking.  The symptoms are gradually improving.  He had no tenderness on palpation today.  A handout on lower extremity muscle strengthening exercises was given.  Primary osteoarthritis of both feet-he has not been discomfort in his feet.  Proper fitting shoes were discussed.  S/P lumbar spinal fusion -he has intermittent discomfort in his lower back.  Core strengthening exercises were emphasized.  History of lumbar spine fusion x2.   Essential hypertension-patient states that his blood pressure was recently elevated.  He was switched to losartan/HCTZ by Dr. Jacinto Halim.  Patient is concerned as in the past she had 2 minor gout episodes while he was on a diuretic.  I advised him to monitor closely and discussed this with Dr. Jacinto Halim if he has recurrence of gout symptoms.  History of hyperlipidemia  Anxiety and depression  Osteoporosis screening - DEXA 08/15/2020 T-score: 0.2, BMD: 0.956 left femoral neck.  Use of calcium rich diet and daily exercise was discussed.  Orders: No orders of the defined types were placed in this encounter.  No orders of the defined types were placed in this encounter.    Follow-Up Instructions: Return in about 1 year (around 02/22/2023) for Osteoarthritis.   Pollyann Savoy, MD  Note - This record has been created using Animal nutritionist.  Chart creation errors have been sought, but may not always  have been located. Such creation errors do not reflect on  the standard of medical care.

## 2022-02-12 ENCOUNTER — Encounter: Payer: Self-pay | Admitting: Cardiology

## 2022-02-12 ENCOUNTER — Ambulatory Visit
Admission: RE | Admit: 2022-02-12 | Discharge: 2022-02-12 | Disposition: A | Discharge status: Home / Self Care | Payer: Medicare Other | Source: Ambulatory Visit | Attending: Cardiology | Admitting: Cardiology

## 2022-02-12 ENCOUNTER — Ambulatory Visit: Payer: Medicare Other | Admitting: Cardiology

## 2022-02-12 VITALS — BP 156/96 | HR 93 | Temp 98.7°F | Resp 16 | Ht 71.0 in | Wt 220.2 lb

## 2022-02-12 DIAGNOSIS — R0609 Other forms of dyspnea: Secondary | ICD-10-CM

## 2022-02-12 DIAGNOSIS — I77810 Thoracic aortic ectasia: Secondary | ICD-10-CM

## 2022-02-12 DIAGNOSIS — R5381 Other malaise: Secondary | ICD-10-CM

## 2022-02-12 DIAGNOSIS — R600 Localized edema: Secondary | ICD-10-CM

## 2022-02-12 DIAGNOSIS — I10 Essential (primary) hypertension: Secondary | ICD-10-CM

## 2022-02-12 MED ORDER — LOSARTAN POTASSIUM-HCTZ 100-25 MG PO TABS: 1.0000 | ORAL_TABLET | ORAL | 2 refills | Status: DC

## 2022-02-12 NOTE — Progress Notes (Signed)
Primary Physician/Referring:  [name], MD  Patient ID: Richard Williams, male    DOB: November 06, 1940, 81 y.o.   MRN: [id number]  Chief Complaint  Patient presents with   Congestive Heart Failure   Hypertension   Follow-up   HPI:    Richard Williams  is a 81 y.o. Caucasian male with hypertension, mild aortic root dilatation, mild pectus excavatum, scoliosis, chronic dyspnea on exertion, GERD, lumbar disc disease, chronic elevated heart rate (sinus tachycardia).  I had seen him 6 months ago, he called our office made an appointment stating that he has been getting gradually worsening dyspnea on exertion, marked fatigue.  No PND or orthopnea.  He has had chronic leg edema but states that it is gradually getting slightly worse.  Mostly was in the left leg but now he notices similar edema in the right leg.  His activity has been limited due to arthritis.  Wife has noticed marked decrease in his physical activity as well over the past few months.  Patient states that even walking within the house he gets markedly dyspneic.  No hemoptysis, no chest pain, no palpitations or dizziness or syncope.  Past Medical History:  Diagnosis Date   Anxiety    Arthritis    BPH (benign prostatic hypertrophy)    on meds   Depression    Dysrhythmia    1 episode of AF but never came back   Enlarged aorta (HCC)    GERD (gastroesophageal reflux disease)    on meds   Hyperlipidemia    Hypertension    Neuromuscular disorder (HCC)     right groin with numbness states he had back surgery in hopes to correct this, but still there   Peripheral vascular disease ([place])    surface blood clot x1 top of left foot   Pneumonia    years ago 14,    Family History  Problem Relation Age of Onset   Stroke Mother    Heart Problems Father    Macular degeneration Sister    Healthy Daughter    Social History   Tobacco Use   Smoking status: Never   Smokeless tobacco: Never  Substance Use Topics   Alcohol use: Yes     Comment: once or twice weekly   Marital Status: Married  ROS  Review of Systems  Constitutional: Positive for malaise/fatigue.  Cardiovascular:  Positive for dyspnea on exertion and leg swelling. Negative for chest pain.   Objective      02/12/2022   10:38 AM 02/12/2022   10:29 AM 08/16/2021   11:00 AM  Vitals with BMI  Height  5' 11"  5' 11"   Weight  220 lbs 3 oz 212 lbs  BMI  09.32 67.12  Systolic 458 099 833  Diastolic 96 99 79  Pulse 93 100 103      Physical Exam Neck:     Vascular: No JVD.  Cardiovascular:     Rate and Rhythm: Normal rate and regular rhythm.     Pulses: Intact distal pulses.     Heart sounds: Normal heart sounds. No murmur heard.    No gallop.  Pulmonary:     Effort: Pulmonary effort is normal.     Breath sounds: Examination of the right-lower field reveals rales. Examination of the left-lower field reveals rales. Rales present.  Abdominal:     General: Bowel sounds are normal.     Palpations: Abdomen is soft.  Musculoskeletal:     Right lower leg: Edema (  1-2+ pitting left below-knee) present.     Left lower leg: Edema (1-2+ pitting left below-knee) present.    Laboratory examination:   External labs:   Labs 09/13/2021:  S. Glucose 77 mg, BUN 23, creatinine 1.12, EGFR 67.  mL.  Labs 07/09/2021:  Hb 15.2/HCT 44.3, platelets 200, normal indicis.  Total cholesterol 138, triglycerides 90, HDL 41, LDL 80.  Serum glucose 92 mg, BUN 21, creatinine 1.34, EGFR 54.  mL.  Potassium 4.2, sodium 137, LFTs normal.  Medications and allergies   Allergies  Allergen Reactions   Codeine Hives   Other Itching and Other (See Comments)    OPIODS--All over itch  Including Laudanum   Lorazepam     Pt stated, "I had a side effect -- I could not control my urination"   Amoxicillin Other (See Comments)    Cold sores  Has patient had a PCN reaction causing immediate rash, facial/tongue/throat swelling, SOB or lightheadedness with hypotension: No Has  patient had a PCN reaction causing severe rash involving mucus membranes or skin necrosis: Yes Has patient had a PCN reaction that required hospitalization: No Has patient had a PCN reaction occurring within the last 10 years: No If all of the above answers are "NO", then may proceed with Cephalosporin use.     Outpatient Encounter Medications as of 02/12/2022  Medication Sig   Calcium Carbonate-Vitamin D (CALTRATE 600+D PO) Take 1 tablet by mouth daily.   esomeprazole (NEXIUM) 40 MG capsule Take 40 mg by mouth daily.   ezetimibe (ZETIA) 10 MG tablet Take 10 mg by mouth. M, W, Friday only   fluticasone (FLONASE) 50 MCG/ACT nasal spray Place 1 spray into both nostrils daily after supper.   lidocaine (LIDODERM) 5 % Place 1 patch onto the skin as needed for pain.   loratadine (CLARITIN) 10 MG tablet Take 10 mg by mouth daily.   losartan-hydrochlorothiazide (HYZAAR) 100-25 MG tablet Take 1 tablet by mouth every morning.   metoprolol succinate (TOPROL-XL) 50 MG 24 hr tablet TAKE 1 TABLET BY MOUTH ONCE DAILY WITH MEAL OR IMMEDIATELY FOLLOWING A MEAL.(DISCONTINUE DILTIAZEM-EDEA)   MIRALAX 17 GM/SCOOP powder as needed.   NON FORMULARY ALTERIL Sleep Aid   Ophthalmic Irrigation Solution (OCUSOFT EYE [place] OP) Apply 1 application to eye at bedtime.   Pyridoxine HCl (VITAMIN B-6 PO) Take by mouth daily.   terazosin (HYTRIN) 5 MG capsule Take 10 mg by mouth at bedtime.   [DISCONTINUED] lisinopril (ZESTRIL) 20 MG tablet Take 1 tablet (20 mg total) by mouth daily.   NUCYNTA 50 MG tablet Take 50 mg by mouth as needed. (Patient not taking: Reported on 02/12/2022)   [DISCONTINUED] Cholecalciferol (VITAMIN D3) 25 MCG (1000 UT) CAPS Take 1 capsule by mouth daily.   [DISCONTINUED] vitamin C (ASCORBIC ACID) 500 MG tablet SMARTSIG:1 By Mouth   No facility-administered encounter medications on file as of 02/12/2022.    Radiology:  Chest x-ray PA and lateral 09/01/2017: Mediastinum and hilar structures normal.  Mild bibasilar subsegmental atelectasis. No pleural effusion or pneumothorax.  Cardiomegaly with normal pulmonary vascularity.  Degenerative changes thoracic spine. Lumbar spine fusion. Cardiac Studies:   Treadmill exercise stress test 12/07/2014: Patient exercised for 9 minutes in [place] stage III, achieving 10 METS workload.  There was no evidence of ischemia.  Normal blood pressure response.  Achieved 114% of MPHR.  PCV ECHOCARDIOGRAM COMPLETE 08/07/2021 : Left ventricle cavity is normal in size. Moderate concentric hypertrophy of the left ventricle. 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  Normal LV systolic function with EF 58%. Doppler evidence of grade I (impaired) diastolic dysfunction, normal LAP. Mild (Grade I) mitral regurgitation. Mild tricuspid regurgitation. Mild pulmonic regurgitation. The aortic root is dilated at 4.1 cm. IVC is not seen. Compared to previous study on 08/03/2020, mild MR and PI are new. No other significant change noted.   EKG   EKG 02/12/2022: Normal sinus rhythm at rate of 86 bpm, left atrial enlargement, left axis deviation, left anterior fascicular block.  LVH.  No significant change from 08/16/2021.   Assessment     ICD-10-CM   1. Dyspnea on exertion  R06.09 PCV MYOCARDIAL PERFUSION WITH LEXISCAN    Pro b natriuretic peptide (BNP)    DG Chest 2 View    2. Primary hypertension  I10 EKG 12-Lead    losartan-hydrochlorothiazide (HYZAAR) 100-25 MG tablet    3. Bilateral leg edema  R60.0     4. Malaise and fatigue  R53.81    R53.83     5. Aortic root dilatation (HCC)  I77.810 losartan-hydrochlorothiazide (HYZAAR) 100-25 MG tablet      Meds ordered this encounter  Medications   losartan-hydrochlorothiazide (HYZAAR) 100-25 MG tablet    Sig: Take 1 tablet by mouth every morning.    Dispense:  30 tablet    Refill:  2   Medications Discontinued During This Encounter  Medication Reason   vitamin C (ASCORBIC ACID) 500 MG tablet    Cholecalciferol  (VITAMIN D3) 25 MCG (1000 UT) CAPS    lisinopril (ZESTRIL) 20 MG tablet Change in therapy   Recommendations:   Richard Williams  is a 81 y.o. Caucasian male with hypertension, mild aortic root dilatation, mild pectus excavatum, scoliosis, chronic dyspnea on exertion, GERD, lumbar disc disease, chronic elevated heart rate (sinus tachycardia).  I had seen him 6 months ago, he called our office made an appointment stating that he has been getting gradually worsening dyspnea on exertion, marked fatigue.  No significant change in leg edema that is chronic as well.  His symptoms may be related to deconditioning, however, chronic diastolic heart failure, coronary artery disease, uncontrolled hypertension may all be contributing along with mild obesity.  On physical exam he has bibasilar crackles and prior echocardiogram also had revealed bibasilar atelectasis.  He may have a component of pulmonary etiology as well for his dyspnea.  I will obtain a chest x-ray PA and lateral view.  Physical examination otherwise is unremarkable and unchanged from previous, he does have 2+ below-knee bilateral leg edema left worse than the right.  Mostly at the ankles.  Fatigue could be component of generalized deconditioning.  Denies loud snoring or apneic episodes.  Wife present.  He also has frequency of urination related to prostate issues.  Hence sleep disordered breathing due to this could also be another reason for his fatigue.  This needs to be addressed by his PCP.  We will discontinue lisinopril and switch him to losartan HCT 100/25 mg in the morning both for control of hypertension and diastolic dysfunction and hopefully will help leg edema.  With aortic root dilatation, this may be more appropriate therapy as well.  He has been complaining of cough and hopefully changing this medication would help as well.  Continue metoprolol XL 50 mg daily both for underlying sinus tachycardia and hypertension.  Schedule him for  Lexiscan nuclear stress test.  Patient unable to exercise due to arthritis issues and poor balance.  Echocardiogram recently performed 6 months ago appears stable from previous.  Hence do not need to repeat this for now.  We will obtain a BMP and NT proBNP today to establish a baseline.  I reviewed his labs performed by his PCP.  Labs appear stable with normal renal function, lipids in the absence of known vascular disease appear to be controlled as well.  This was a 40-minute office visit encounter and discussions regarding congestive heart failure, discussions regarding hypertension, evaluation of external medical records.   Adrian Prows, MD, [facility] 02/12/2022, 12:27 PM Office: [phone] Fax: [phone] Pager: [phone]

## 2022-02-13 LAB — PRO B NATRIURETIC PEPTIDE: NT-Pro BNP: 166 pg/mL (ref 0–486)

## 2022-02-13 NOTE — Progress Notes (Signed)
Chest x-ray PA and lateral view 02/13/2022: 1. Stable cardiomegaly. No pulmonary venous congestion. 2. Low lung volumes. Mild left base infiltrate cannot be excluded. 3. Probable hiatal hernia. 4. Degenerative change and scoliosis thoracolumbar spine. Postsurgical changes lumbar spine.

## 2022-02-18 ENCOUNTER — Ambulatory Visit: Payer: Medicare Other

## 2022-02-18 DIAGNOSIS — R0609 Other forms of dyspnea: Secondary | ICD-10-CM

## 2022-02-19 ENCOUNTER — Other Ambulatory Visit: Payer: Self-pay | Admitting: Cardiology

## 2022-02-19 DIAGNOSIS — R0609 Other forms of dyspnea: Secondary | ICD-10-CM

## 2022-02-19 DIAGNOSIS — R9389 Abnormal findings on diagnostic imaging of other specified body structures: Secondary | ICD-10-CM

## 2022-02-19 NOTE — Progress Notes (Signed)
yes

## 2022-02-19 NOTE — Progress Notes (Signed)
ICD-10-CM   1. Dyspnea on exertion  R06.09 Ambulatory referral to Pulmonology    2. Abnormal CXR  R93.89 Ambulatory referral to Pulmonology     Orders Placed This Encounter  Procedures   Ambulatory referral to Pulmonology    Referral Priority:   Routine    Referral Type:   Consultation    Referral Reason:   Specialty Services Required    Requested Specialty:   Pulmonary Disease    Number of Visits Requested:   1

## 2022-02-21 ENCOUNTER — Ambulatory Visit: Payer: Medicare Other | Attending: Rheumatology | Admitting: Rheumatology

## 2022-02-21 ENCOUNTER — Encounter: Payer: Self-pay | Admitting: Rheumatology

## 2022-02-21 VITALS — BP 121/75 | HR 94 | Resp 18 | Ht 71.0 in | Wt 218.2 lb

## 2022-02-21 DIAGNOSIS — M19041 Primary osteoarthritis, right hand: Secondary | ICD-10-CM

## 2022-02-21 DIAGNOSIS — F419 Anxiety disorder, unspecified: Secondary | ICD-10-CM

## 2022-02-21 DIAGNOSIS — Z8639 Personal history of other endocrine, nutritional and metabolic disease: Secondary | ICD-10-CM

## 2022-02-21 DIAGNOSIS — G5601 Carpal tunnel syndrome, right upper limb: Secondary | ICD-10-CM | POA: Diagnosis not present

## 2022-02-21 DIAGNOSIS — Z13820 Encounter for screening for osteoporosis: Secondary | ICD-10-CM

## 2022-02-21 DIAGNOSIS — M79645 Pain in left finger(s): Secondary | ICD-10-CM

## 2022-02-21 DIAGNOSIS — I10 Essential (primary) hypertension: Secondary | ICD-10-CM

## 2022-02-21 DIAGNOSIS — M19071 Primary osteoarthritis, right ankle and foot: Secondary | ICD-10-CM

## 2022-02-21 DIAGNOSIS — M19072 Primary osteoarthritis, left ankle and foot: Secondary | ICD-10-CM

## 2022-02-21 DIAGNOSIS — F32A Depression, unspecified: Secondary | ICD-10-CM

## 2022-02-21 DIAGNOSIS — M19042 Primary osteoarthritis, left hand: Secondary | ICD-10-CM

## 2022-02-21 DIAGNOSIS — M65312 Trigger thumb, left thumb: Secondary | ICD-10-CM

## 2022-02-21 DIAGNOSIS — M25561 Pain in right knee: Secondary | ICD-10-CM

## 2022-02-21 DIAGNOSIS — Z981 Arthrodesis status: Secondary | ICD-10-CM

## 2022-02-21 NOTE — Patient Instructions (Addendum)
Hand Exercises Hand exercises can be helpful for almost anyone. These exercises can strengthen the hands, improve flexibility and movement, and increase blood flow to the hands. These results can make work and daily tasks easier. Hand exercises can be especially helpful for people who have joint pain from arthritis or have nerve damage from overuse (carpal tunnel syndrome). These exercises can also help people who have injured a hand. Exercises Most of these hand exercises are gentle stretching and motion exercises. It is usually safe to do them often throughout the day. Warming up your hands before exercise may help to reduce stiffness. You can do this with gentle massage or by placing your hands in warm water for 10-15 minutes. It is normal to feel some stretching, pulling, tightness, or mild discomfort as you begin new exercises. This will gradually improve. Stop an exercise right away if you feel sudden, severe pain or your pain gets worse. Ask your health care provider which exercises are best for you. Knuckle bend or "claw" fist  Stand or sit with your arm, hand, and all five fingers pointed straight up. Make sure to keep your wrist straight during the exercise. Gently bend your fingers down toward your palm until the tips of your fingers are touching the top of your palm. Keep your big knuckle straight and just bend the small knuckles in your fingers. Hold this position for __________ seconds. Straighten (extend) your fingers back to the starting position. Repeat this exercise 5-10 times with each hand. Full finger fist  Stand or sit with your arm, hand, and all five fingers pointed straight up. Make sure to keep your wrist straight during the exercise. Gently bend your fingers into your palm until the tips of your fingers are touching the middle of your palm. Hold this position for __________ seconds. Extend your fingers back to the starting position, stretching every joint fully. Repeat  this exercise 5-10 times with each hand. Straight fist Stand or sit with your arm, hand, and all five fingers pointed straight up. Make sure to keep your wrist straight during the exercise. Gently bend your fingers at the big knuckle, where your fingers meet your hand, and the middle knuckle. Keep the knuckle at the tips of your fingers straight and try to touch the bottom of your palm. Hold this position for __________ seconds. Extend your fingers back to the starting position, stretching every joint fully. Repeat this exercise 5-10 times with each hand. Tabletop  Stand or sit with your arm, hand, and all five fingers pointed straight up. Make sure to keep your wrist straight during the exercise. Gently bend your fingers at the big knuckle, where your fingers meet your hand, as far down as you can while keeping the small knuckles in your fingers straight. Think of forming a tabletop with your fingers. Hold this position for __________ seconds. Extend your fingers back to the starting position, stretching every joint fully. Repeat this exercise 5-10 times with each hand. Finger spread  Place your hand flat on a table with your palm facing down. Make sure your wrist stays straight as you do this exercise. Spread your fingers and thumb apart from each other as far as you can until you feel a gentle stretch. Hold this position for __________ seconds. Bring your fingers and thumb tight together again. Hold this position for __________ seconds. Repeat this exercise 5-10 times with each hand. Making circles  Stand or sit with your arm, hand, and all five fingers pointed   straight up. Make sure to keep your wrist straight during the exercise. Make a circle by touching the tip of your thumb to the tip of your index finger. Hold for __________ seconds. Then open your hand wide. Repeat this motion with your thumb and each finger on your hand. Repeat this exercise 5-10 times with each hand. Thumb  motion  Sit with your forearm resting on a table and your wrist straight. Your thumb should be facing up toward the ceiling. Keep your fingers relaxed as you move your thumb. Lift your thumb up as high as you can toward the ceiling. Hold for __________ seconds. Bend your thumb across your palm as far as you can, reaching the tip of your thumb for the small finger (pinkie) side of your palm. Hold for __________ seconds. Repeat this exercise 5-10 times with each hand. Grip strengthening  Hold a stress ball or other soft ball in the middle of your hand. Slowly increase the pressure, squeezing the ball as much as you can without causing pain. Think of bringing the tips of your fingers into the middle of your palm. All of your finger joints should bend when doing this exercise. Hold your squeeze for __________ seconds, then relax. Repeat this exercise 5-10 times with each hand. Contact a health care provider if: Your hand pain or discomfort gets much worse when you do an exercise. Your hand pain or discomfort does not improve within 2 hours after you exercise. If you have any of these problems, stop doing these exercises right away. Do not do them again unless your health care provider says that you can. Get help right away if: You develop sudden, severe hand pain or swelling. If this happens, stop doing these exercises right away. Do not do them again unless your health care provider says that you can. This information is not intended to replace advice given to you by your health care provider. Make sure you discuss any questions you have with your health care provider. Document Revised: 08/10/2020 Document Reviewed: 08/10/2020 [employer] Patient Education  2023 [employer] Exercises for Chronic Knee Pain Chronic knee pain is pain that lasts longer than 3 months. For most people with chronic knee pain, exercise and weight loss is an important part of treatment. Your health care provider may want  you to focus on: Strengthening the muscles that support your knee. This can take pressure off your knee and lessen pain. Preventing knee stiffness. Maintaining or increasing how far you can move your knee. Losing weight (if this applies) to take pressure off your knee, decrease your risk for injury, and make it easier for you to exercise. Your health care provider will help you develop an exercise program that matches your needs and physical abilities. Below are simple, low-impact exercises you can do at home. Ask your health care provider or a physical therapist how often you should do your exercise program and how many times to repeat each exercise. General safety tips Follow these safety tips for exercising with chronic knee pain: Get your health care provider's approval before doing any exercises. Start slowly and stop any time an exercise causes pain. Do not exercise if your knee pain is flaring up. Warm up first. Stretching a cold muscle can cause an injury. Do 5-10 minutes of easy movement or light stretching before beginning your exercise routine. Do 5-10 minutes of low-impact activity (like walking or cycling) before starting strengthening exercises. Contact your health care provider any time you have pain   during or after exercising. Exercise may cause discomfort but should not be painful. It is normal to be a little stiff or sore after exercising.  Stretching and range-of-motion exercises Front thigh stretch  Stand up straight and support your body by holding on to a chair or resting one hand on a wall. With your legs straight and close together, bend one knee to lift your heel up toward your buttocks. Using one hand for support, grab your ankle with your free hand. Pull your foot up closer toward your buttocks to feel the stretch in front of your thigh. Hold the stretch for 30 seconds. Repeat __________ times. Complete this exercise __________ times a day. Back thigh stretch  Sit  on the floor with your back straight and your legs out straight in front of you. Place the palms of your hands on the floor and slide them toward your feet as you bend at the hip. Try to touch your nose to your knees and feel the stretch in the back of your thighs. Hold for 30 seconds. Repeat __________ times. Complete this exercise __________ times a day. Calf stretch  Stand facing a wall. Place the palms of your hands flat against the wall, arms extended, and lean slightly against the wall. Get into a lunge position with one leg bent at the knee and the other leg stretched out straight behind you. Keep both feet facing the wall and increase the bend in your knee while keeping the heel of the other leg flat on the ground. You should feel the stretch in your calf. Hold for 30 seconds. Repeat __________ times. Complete this exercise __________ times a day. Strengthening exercises Straight leg lift Lie on your back with one knee bent and the other leg out straight. Slowly lift the straight leg without bending the knee. Lift until your foot is about 12 inches (30 cm) off the floor. Hold for 3-5 seconds and slowly lower your leg. Repeat __________ times. Complete this exercise __________ times a day. Single leg dip Stand between two chairs and put both hands on the backs of the chairs for support. Extend one leg out straight with your body weight resting on the heel of the standing leg. Slowly bend your standing knee to dip your body to the level that is comfortable for you. Hold for 3-5 seconds. Repeat __________ times. Complete this exercise __________ times a day. Hamstring curls Stand straight, knees close together, facing the back of a chair. Hold on to the back of a chair with both hands. Keep one leg straight. Bend the other knee while bringing the heel up toward the buttock until the knee is bent at a 90-degree angle (right angle). Hold for 3-5 seconds. Repeat __________ times.  Complete this exercise __________ times a day. Wall squat Stand straight with your back, hips, and head against a wall. Step forward one foot at a time with your back still against the wall. Your feet should be 2 feet (61 cm) from the wall at shoulder width. Keeping your back, hips, and head against the wall, slide down the wall to as close of a sitting position as you can get. Hold for 5-10 seconds, then slowly slide back up. Repeat __________ times. Complete this exercise __________ times a day. Step-ups Step up with one foot onto a sturdy platform or stool that is about 6 inches (15 cm) high. Face sideways with one foot on the platform and one on the ground. Place all your weight on   weight on the platform foot and lift your body off the ground until your knee extends. Let your other leg hang free to the side. Hold for 3-5 seconds then slowly lower your weight down to the floor foot. Repeat __________ times. Complete this exercise __________ times a day. Contact a health care provider if: Your exercise causes pain. Your pain is worse after you exercise. Your pain prevents you from doing your exercises. This information is not intended to replace advice given to you by your health care provider. Make sure you discuss any questions you have with your health care provider. Document Revised: 08/26/2019 Document Reviewed: 04/19/2019 [employer] Patient Education  2023 [employer]  Carpal Tunnel Syndrome  Carpal tunnel syndrome is a condition that causes pain, weakness, and numbness in your hand and arm. Numbness is when you cannot feel an area in your body. The carpal tunnel is a narrow area that is on the palm side of your wrist. Repeated wrist motion or certain diseases may cause swelling in the tunnel. This swelling can pinch the main nerve in the wrist. This nerve is called the median nerve. What are the causes? This condition may be caused by: Moving your hand and wrist over and  over again while doing a task. Injury to the wrist. Arthritis. A sac of fluid (cyst) or abnormal growth (tumor) in the carpal tunnel. Fluid buildup during pregnancy. Use of tools that vibrate. Sometimes the cause is not known. What increases the risk? The following factors may make you more likely to have this condition: Having a job that makes you do these things: Move your hand over and over again. Work with tools that vibrate, such as drills or sanders. Being a woman. Having diabetes, obesity, thyroid problems, or kidney failure. What are the signs or symptoms? Symptoms of this condition include: A tingling feeling in your fingers. Tingling or loss of feeling in your hand. Pain in your entire arm. This pain may get worse when you bend your wrist and elbow for a long time. Pain in your wrist that goes up your arm to your shoulder. Pain that goes down into your palm or fingers. Weakness in your hands. You may find it hard to grab and hold items. You may feel worse at night. How is this treated? This condition may be treated with: Lifestyle changes. You will be asked to stop or change the activity that caused your problem. Doing exercises and activities that make bones, muscles, and tendons stronger (physical therapy). Learning how to use your hand again (occupational therapy). Medicines for pain and swelling. You may have injections in your wrist. A wrist splint or brace. Surgery. Follow these instructions at home: If you have a splint or brace: Wear the splint or brace as told by your doctor. Take it off only as told by your doctor. Loosen the splint if your fingers: Tingle. Become numb. Turn cold and blue. Keep the splint or brace clean. If the splint or brace is not waterproof: Do not let it get wet. Cover it with a watertight covering when you take a bath or a shower. Managing pain, stiffness, and swelling If told, put ice on the painful area: If you have a removable  splint or brace, remove it as told by your doctor. Put ice in a plastic bag. Place a towel between your skin and the bag. Leave the ice on for 20 minutes, 2-3 times per day. Do not fall asleep with the cold pack on your  skin. Take off the ice if your skin turns bright red. This is very important. If you cannot feel pain, heat, or cold, you have a greater risk of damage to the area. Move your fingers often to reduce stiffness and swelling. General instructions Take over-the-counter and prescription medicines only as told by your doctor. Rest your wrist from any activity that may cause pain. If needed, talk with your boss at work about changes that can help your wrist heal. Do exercises as told by your doctor, physical therapist, or occupational therapist. Keep all follow-up visits. Contact a doctor if: You have new symptoms. Medicine does not help your pain. Your symptoms get worse. Get help right away if: You have very bad numbness or tingling in your wrist or hand. Summary Carpal tunnel syndrome is a condition that causes pain in your hand and arm. It is often caused by repeated wrist motions. Lifestyle changes and medicines are used to treat this problem. Surgery may help in very bad cases. Follow your doctor's instructions about wearing a splint, resting your wrist, keeping follow-up visits, and calling for help. This information is not intended to replace advice given to you by your health care provider. Make sure you discuss any questions you have with your health care provider. Document Revised: 09/02/2019 Document Reviewed: 09/02/2019 Elsevier Patient Education  [place].

## 2022-02-23 ENCOUNTER — Encounter: Payer: Self-pay | Admitting: Cardiology

## 2022-02-25 NOTE — Telephone Encounter (Signed)
From pt

## 2022-03-08 ENCOUNTER — Ambulatory Visit: Payer: Medicare Other | Admitting: Internal Medicine

## 2022-03-08 ENCOUNTER — Encounter: Payer: Self-pay | Admitting: Internal Medicine

## 2022-03-08 VITALS — BP 110/62 | HR 90 | Temp 98.0°F | Ht 71.0 in | Wt 217.6 lb

## 2022-03-08 DIAGNOSIS — R0602 Shortness of breath: Secondary | ICD-10-CM | POA: Diagnosis not present

## 2022-03-08 DIAGNOSIS — R0609 Other forms of dyspnea: Secondary | ICD-10-CM | POA: Insufficient documentation

## 2022-03-08 LAB — SPIROMETRY WITH GRAPH

## 2022-03-08 LAB — NITRIC OXIDE: Nitric Oxide: 7

## 2022-03-08 NOTE — Patient Instructions (Addendum)
I emphasized that nasal steroids (flonase) have no immediate benefit in terms of improving symptoms.  To help them reached the target tissue, the patient should use Afrin one puffs every 12 hours applied one min before using the flonase.  Afrin should be stopped after no more than 5 days.  If the symptoms worsen, Afrin can be restarted after 5 days off of therapy to prevent rebound congestion from overuse of Afrin.  I also emphasized that in no way are nasal steroids a concern in terms of "addiction".   Continue nexium 40 mg Take 30-60 min before first meal of the day and Pepcid 20 mg (otc) after supper   GERD (REFLUX)  is an extremely common cause of respiratory symptoms just like yours , many times with no obvious heartburn at all.    It can be treated with medication, but also with lifestyle changes including elevation of the head of your bed (ideally with 6 -8inch blocks under the headboard of your bed),  Smoking cessation, avoidance of late meals, excessive alcohol, and avoid fatty foods, chocolate, peppermint, colas, red wine, and acidic juices such as orange juice.  NO MINT OR MENTHOL PRODUCTS SO NO COUGH DROPS  USE SUGARLESS CANDY INSTEAD (Jolley ranchers or Stover's or Life Savers) or even ice chips will also do - the key is to swallow to prevent all throat clearing. NO OIL BASED VITAMINS - use powdered substitutes.  Avoid fish oil when coughing.   To get the most out of exercise, you need to be continuously aware that you are short of breath, but never out of breath, for at least 30 minutes daily. As you improve, it will actually be easier for you to do the same amount of exercise  in  30 minutes so always push to the level where you are short of breath.       Please schedule a follow up visit in 2  months but call sooner if needed

## 2022-03-08 NOTE — Progress Notes (Unsigned)
Richard Williams, male    DOB: 1940/07/31   MRN: [id number]   Brief patient profile:  41 yowm never smoker / [employer] grad from [place]  referred to pulmonary clinic 03/08/2022 by Dr Zenaida Deed for doe onset early spring 2023  assoc with wt gain  w/u by Nadara Eaton neg for IHD   Moved here from [place] with chronic rhinitis only responds to afrin/ [place] [facility] w/u neg     History of Present Illness  03/08/2022  Pulmonary/ 1st office eval/Richard Williams  Chief Complaint  Patient presents with   Consult    Pt states he noticed SOB with exertion happening, for the past 6-8 months, so he went to his PCP. Pt states his PCP sent him here  Dyspnea:  stationery bike x 30 min no sob / bending over main trigger for sob  Cough: minimal dry dayime sporadic x years assoc with rhinitis w/u as above  Sleep: 6 in bed blocks  SABA use: flonase > helps nasal symptoms some / afrin best  No obvious day to day or daytime pattern/variability or assoc excess/ purulent sputum or mucus plugs or hemoptysis or cp or chest tightness, subjective wheeze or overt  hb symptoms.   Sleeping  without nocturnal  or early am exacerbation  of respiratory  c/o's or need for noct saba. Also denies any obvious fluctuation of symptoms with weather or environmental changes or other aggravating or alleviating factors except as outlined above   No unusual exposure hx or h/o childhood pna/ asthma or knowledge of premature birth.  Current Allergies, Complete Past Medical History, Past Surgical History, Family History, and Social History were reviewed in [employer] record.  ROS  The following are not active complaints unless bolded Hoarseness, sore throat, dysphagia, dental problems, itching, sneezing,  nasal congestion or discharge of excess mucus or purulent secretions, ear ache,   fever, chills, sweats, unintended wt loss or wt gain, classically pleuritic or exertional cp,  orthopnea pnd or arm/hand swelling  or leg swelling,  presyncope, palpitations, abdominal pain, anorexia, nausea, vomiting, diarrhea  or change in bowel habits or change in bladder habits, change in stools or change in urine, dysuria, hematuria,  rash, arthralgias, visual complaints, headache, numbness, weakness or ataxia or problems with walking or coordination,  change in mood or  memory.           Past Medical History:  Diagnosis Date   Anxiety    Arthritis    BPH (benign prostatic hypertrophy)    on meds   Depression    Dysrhythmia    1 episode of AF but never came back   Enlarged aorta (HCC)    GERD (gastroesophageal reflux disease)    on meds   Hyperlipidemia    Hypertension    Neuromuscular disorder (HCC)     right groin with numbness states he had back surgery in hopes to correct this, but still there   Peripheral vascular disease (HCC)    surface blood clot x1 top of left foot   Pneumonia    years ago 5,    Outpatient Medications Prior to Visit  Medication Sig Dispense Refill   Calcium Carbonate-Vitamin D (CALTRATE 600+D PO) Take 1 tablet by mouth daily.     esomeprazole (NEXIUM) 40 MG capsule Take 20 mg by mouth daily.     ezetimibe (ZETIA) 10 MG tablet Take 10 mg by mouth. M, W, Friday only     fluticasone (FLONASE) 50 MCG/ACT nasal  spray Place 1 spray into both nostrils daily after supper.     lidocaine (LIDODERM) 5 % Place 1 patch onto the skin as needed for pain.     loratadine (CLARITIN) 10 MG tablet Take 10 mg by mouth daily.     losartan-hydrochlorothiazide (HYZAAR) 100-25 MG tablet Take 1 tablet by mouth every morning. 30 tablet 2   metoprolol succinate (TOPROL-XL) 50 MG 24 hr tablet TAKE 1 TABLET BY MOUTH ONCE DAILY WITH MEAL OR IMMEDIATELY FOLLOWING A MEAL.(DISCONTINUE DILTIAZEM-EDEA) 90 tablet 0   MIRALAX 17 GM/SCOOP powder as needed.     NON FORMULARY ALTERIL Sleep Aid     NUCYNTA 50 MG tablet Take 50 mg by mouth as needed.     Ophthalmic Irrigation Solution (OCUSOFT EYE WASH OP) Apply 1 application to eye at  bedtime.     Pyridoxine HCl (VITAMIN B-6 PO) Take by mouth daily.     terazosin (HYTRIN) 5 MG capsule Take 10 mg by mouth at bedtime.     No facility-administered medications prior to visit.     Objective:     BP 110/62 (BP Location: Left Arm, Patient Position: Sitting, Cuff Size: Large)   Pulse 90   Temp 98 F (36.7 C) (Oral)   Ht 5\' 11"  (1.803 m)   Wt 217 lb 9.6 oz (98.7 kg)   SpO2 97% Comment: on RA  BMI 30.35 kg/m   SpO2: 97 % (on RA)  Amb wm nad    HEENT : Oropharynx  clear      Nasal turbinates mod non-specific turbinate edema   NECK :  without  apparent JVD/ palpable Nodes/TM    LUNGS: no acc muscle use,  Nl contour chest which is clear to A and P bilaterally without cough on insp or exp maneuvers   CV:  RRR  no s3 or murmur or increase in P2, and no edema   ABD:  soft and nontender with nl inspiratory excursion in the supine position. No bruits or organomegaly appreciated   MS:  Nl gait/ ext warm without deformities Or obvious joint restrictions  calf tenderness, cyanosis or clubbing    SKIN: warm and dry without lesions    NEURO:  alert, approp, nl sensorium with  no motor or cerebellar deficits apparent.     I personally reviewed images and agree with radiology impression as follows:  CXR:   pa and lateral  02/12/22  1. Stable cardiomegaly. No pulmonary venous congestion. 2. Low lung volumes. Mild left base infiltrate cannot be excluded. 3. Probable hiatal hernia.     Assessment   DOE (dyspnea on exertion) Never smoker/ onset late spring 2023  - Spirometry 03/08/2022  FEV1 2.67 (88%)  Ratio 0.73 with non-physiologic f/v - 03/08/2022   Walked on RA  x  3  lap(s) =  approx 750  ft  @ very fast pace, stopped due to end of study with lowest 02 sats 96% and no sob  - FENO 03/08/2022  =   9   Symptoms are markedly disproportionate to objective findings and not clear to what extent this is actually a pulmonary  problem but pt does appear to have difficult  to sort out respiratory symptoms of unknown origin for which  DDX  = almost all start with A and  include Adherence, Ace Inhibitors, Acid Reflux, Active Sinus Disease, Alpha 1 Antitripsin deficiency, Anxiety masquerading as Airways dz,  ABPA,  Allergy(esp in young), Aspiration (esp in elderly), Adverse effects of meds,  Active smoking or Vaping, A bunch of PE's/clot burden (a few small clots can't cause this syndrome unless there is already severe underlying pulm or vascular dz with poor reserve),  Anemia or thyroid disorder, plus two Bs  = Bronchiectasis and Beta blocker use..and one C= CHF    Adherence is always the initial "prime suspect" and is a multilayered concern that requires a "trust but verify" approach in every patient - starting with knowing how to use medications, especially inhalers, correctly, keeping up with refills and understanding the fundamental difference between maintenance and prns vs those medications only taken for a very short course and then stopped and not refilled.   ? Acid (or non-acid) GERD > always difficult to exclude as up to 75% of pts in some series report no assoc GI/ Heartburn symptoms and he has a large HH> rec max (24h)  acid suppression and diet restrictions/ reviewed and instructions given in writing.   ? Active sinus dz/ rhinitis > flonase/ afrin x 5 days then try off afrin x 5 days to reduce risk of rebound and help deliver the flonase more effectively to the target tissues (used putting cream directly on skin rash analogy)   ? Allergy/asthma > nl FENO/spirometry and neg w/u by Donneta Romberg previously     ? Anemia/ thyroid dz > defer to PCP  ? Anxiety/deconditioning assoc with wt gain > usually at the bottom of this list of usual suspects but s  may interfere with adherence and also interpretation of response or lack thereof to symptom management which can be quite subjective.   ? Adverse drug effects > none of the usual suspects listed   ? A bunch of PEs > no  evidence of PH by echo 08/2021 nor on today's walking study which failed to produce doe with rapid walk  ? Beta blocker effects > not likely at these doses of Toprol   ? Chf > neg cards w/u though he does have diastolic dysfunction  G1 and mild MR with nl LAP by echo 08/07/21 noted   >>> rx rhinitis/ gerd empirically and do sub max ex x 2 m then return for f/u - can do cpst as next step if not making progress   Each maintenance medication was reviewed in detail including emphasizing most importantly the difference between maintenance and prns and under what circumstances the prns are to be triggered using an action plan format where appropriate.  Total time for H and P, chart review, counseling,  directly observing portions of ambulatory 02 saturation study/ and generating customized AVS unique to this office visit / same day charting = 46 min                    Christinia Gully, MD 03/08/2022

## 2022-03-08 NOTE — Assessment & Plan Note (Addendum)
Never smoker/ onset late spring 2023  - Spirometry 03/08/2022  FEV1 2.67 (88%)  Ratio 0.73 with non-physiologic f/v - 03/08/2022   Walked on RA  x  3  lap(s) =  approx 750  ft  @ very fast pace, stopped due to end of study with lowest 02 sats 96% and no sob  - FENO 03/08/2022  =   9   Symptoms are markedly disproportionate to objective findings and not clear to what extent this is actually a pulmonary  problem but pt does appear to have difficult to sort out respiratory symptoms of unknown origin for which  DDX  = almost all start with A and  include Adherence, Ace Inhibitors, Acid Reflux, Active Sinus Disease, Alpha 1 Antitripsin deficiency, Anxiety masquerading as Airways dz,  ABPA,  Allergy(esp in young), Aspiration (esp in elderly), Adverse effects of meds,  Active smoking or Vaping, A bunch of PE's/clot burden (a few small clots can't cause this syndrome unless there is already severe underlying pulm or vascular dz with poor reserve),  Anemia or thyroid disorder, plus two Bs  = Bronchiectasis and Beta blocker use..and one C= CHF    Adherence is always the initial "prime suspect" and is a multilayered concern that requires a "trust but verify" approach in every patient - starting with knowing how to use medications, especially inhalers, correctly, keeping up with refills and understanding the fundamental difference between maintenance and prns vs those medications only taken for a very short course and then stopped and not refilled.   ? Acid (or non-acid) GERD > always difficult to exclude as up to 75% of pts in some series report no assoc GI/ Heartburn symptoms and he has a large HH> rec max (24h)  acid suppression and diet restrictions/ reviewed and instructions given in writing.   ? Active sinus dz/ rhinitis > flonase/ afrin x 5 days then try off afrin x 5 days to reduce risk of rebound and help deliver the flonase more effectively to the target tissues (used putting cream directly on skin rash  analogy)   ? Allergy/asthma > nl FENO/spirometry and neg w/u by [place] Callas previously     ? Anemia/ thyroid dz > defer to PCP  ? Anxiety/deconditioning assoc with wt gain > usually at the bottom of this list of usual suspects but s  may interfere with adherence and also interpretation of response or lack thereof to symptom management which can be quite subjective.   ? Adverse drug effects > none of the usual suspects listed   ? A bunch of PEs > no evidence of PH by echo 08/2021 nor on today's walking study which failed to produce doe with rapid walk  ? Beta blocker effects > not likely at these doses of Toprol   ? Chf > neg cards w/u though he does have diastolic dysfunction  G1 and mild MR with nl LAP by echo 08/07/21 noted   >>> rx rhinitis/ gerd empirically and do sub max ex x 2 m then return for f/u - can do cpst as next step if not making progress   Each maintenance medication was reviewed in detail including emphasizing most importantly the difference between maintenance and prns and under what circumstances the prns are to be triggered using an action plan format where appropriate.  Total time for H and P, chart review, counseling,  directly observing portions of ambulatory 02 saturation study/ and generating customized AVS unique to this office visit / same day charting =  46 min

## 2022-03-09 ENCOUNTER — Encounter: Payer: Self-pay | Admitting: Internal Medicine

## 2022-03-26 ENCOUNTER — Encounter: Payer: Self-pay | Admitting: Cardiology

## 2022-03-26 ENCOUNTER — Ambulatory Visit: Payer: Medicare Other

## 2022-03-26 VITALS — BP 118/75 | HR 82 | Ht 71.0 in | Wt 218.8 lb

## 2022-03-26 DIAGNOSIS — I77810 Thoracic aortic ectasia: Secondary | ICD-10-CM

## 2022-03-26 DIAGNOSIS — I10 Essential (primary) hypertension: Secondary | ICD-10-CM

## 2022-03-26 DIAGNOSIS — R0609 Other forms of dyspnea: Secondary | ICD-10-CM

## 2022-03-26 DIAGNOSIS — R600 Localized edema: Secondary | ICD-10-CM

## 2022-03-26 MED ORDER — LOSARTAN POTASSIUM-HCTZ 100-25 MG PO TABS: 1.0000 | ORAL_TABLET | ORAL | 3 refills | Status: DC

## 2022-03-26 NOTE — Progress Notes (Signed)
Primary Physician/Referring:  [name], MD  Patient ID: Richard Williams, male    DOB: [birth year], 81 y.o.   MRN: [id number]  Chief Complaint  Patient presents with   Follow-up    6 weeks   Hyperlipidemia   Hypertension   HPI:    Richard Williams  is a 81 y.o. Caucasian male with hypertension, mild aortic root dilatation, mild pectus excavatum, scoliosis, chronic dyspnea on exertion, GERD, lumbar disc disease, chronic elevated heart rate (sinus tachycardia).  He was last seen in office for worsening dyspnea on exertion, marked fatigue. He had chronic leg edema that had worsened.  Wife had noticed marked decrease in his physical activity as well over the past few months.  Lisinopril was discontinued and he was started on losartan HCT for blood pressure control and diastolic dysfunction.  He was also referred to Pulmonology. He presents today for 79-week follow-up.  Overall, he is feeling much better and has had seen significant improvement in dyspnea with exertion.  Lower leg edema has completely resolved.  He is currently exercising daily and has been riding his stationary bike every day since last visit.  His goal is to have at least 4000 steps per day.  He does monitor his blood pressure at home, and home readings are very well controlled. He denies chest pain, palpitations, leg edema, orthopnea, PND, syncope.   Past Medical History:  Diagnosis Date   Anxiety    Arthritis    BPH (benign prostatic hypertrophy)    on meds   Depression    Dysrhythmia    1 episode of AF but never came back   Enlarged aorta (HCC)    GERD (gastroesophageal reflux disease)    on meds   Hyperlipidemia    Hypertension    Neuromuscular disorder (HCC)     right groin with numbness states he had back surgery in hopes to correct this, but still there   Peripheral vascular disease ([place])    surface blood clot x1 top of left foot   Pneumonia    years ago 17,    Family History  Problem Relation Age of  Onset   Stroke Mother    Heart Problems Father    Macular degeneration Sister    Healthy Daughter    Social History   Tobacco Use   Smoking status: Never    Passive exposure: Never   Smokeless tobacco: Never  Substance Use Topics   Alcohol use: Yes    Comment: once or twice weekly   Marital Status: Married  ROS  Review of Systems  Constitutional: Negative for malaise/fatigue.  Cardiovascular:  Positive for dyspnea on exertion (improved). Negative for chest pain, leg swelling, orthopnea, paroxysmal nocturnal dyspnea and syncope.   Objective      03/26/2022    3:10 PM 03/08/2022    2:45 PM 02/21/2022    1:59 PM  Vitals with BMI  Height _0  _1  _2   Weight 218 lbs 13 oz 217 lbs 10 oz 218 lbs 3 oz  BMI 30.53 56.21 30.86  Systolic 578 469 629  Diastolic 75 62 75  Pulse 82 90 94    Physical Exam Neck:     Vascular: No JVD.  Cardiovascular:     Rate and Rhythm: Normal rate and regular rhythm.     Pulses: Intact distal pulses.     Heart sounds: Normal heart sounds. No murmur heard.    No gallop.  Pulmonary:  Effort: Pulmonary effort is normal. No respiratory distress.     Breath sounds: No wheezing or rales.  Abdominal:     General: Bowel sounds are normal.     Palpations: Abdomen is soft.  Musculoskeletal:     Right lower leg: No edema.     Left lower leg: No edema.    Laboratory examination:      Latest Ref Rng & Units 03/11/2017    3:52 PM 10/31/2015   12:24 PM 02/03/2014   10:51 AM  BMP  Glucose 65 - 99 mg/dL 125  106  103   BUN 6 - 20 mg/dL _0 Creatinine 0.61 - 1.24 mg/dL 1.29  1.42  1.19   Sodium 135 - 145 mmol/L 138  136  142   Potassium 3.5 - 5.1 mmol/L 4.0  4.2  4.5   Chloride 101 - 111 mmol/L 106  104  104   CO2 22 - 32 mmol/L _1 Calcium 8.9 - 10.3 mg/dL 9.2  9.6  9.1    ProBNP    Component Value Date/Time   PROBNP 166 02/12/2022 1200    External labs:   Labs 09/13/2021:  S. Glucose 77 mg, BUN 23,  creatinine 1.12, EGFR 67.  mL.  Labs 07/09/2021:  Hb 15.2/HCT 44.3, platelets 200, normal indicis.  Total cholesterol 138, triglycerides 90, HDL 41, LDL 80.  Serum glucose 92 mg, BUN 21, creatinine 1.34, EGFR 54.  mL.  Potassium 4.2, sodium 137, LFTs normal.  Medications and allergies   Allergies  Allergen Reactions   Codeine Hives   Other Itching and Other (See Comments)    OPIODS--All over itch  Including Laudanum   Lorazepam     Pt stated, "I had a side effect -- I could not control my urination"   Amoxicillin Other (See Comments)    Cold sores  Has patient had a PCN reaction causing immediate rash, facial/tongue/throat swelling, SOB or lightheadedness with hypotension: No Has patient had a PCN reaction causing severe rash involving mucus membranes or skin necrosis: Yes Has patient had a PCN reaction that required hospitalization: No Has patient had a PCN reaction occurring within the last 10 years: No If all of the above answers are "NO", then may proceed with Cephalosporin use.     Outpatient Encounter Medications as of 03/26/2022  Medication Sig   Calcium Carbonate-Vitamin D (CALTRATE 600+D PO) Take 1 tablet by mouth daily.   esomeprazole (NEXIUM) 40 MG capsule Take 40 mg by mouth daily.   ezetimibe (ZETIA) 10 MG tablet Take 10 mg by mouth. M, W, Friday only   fluticasone (FLONASE) 50 MCG/ACT nasal spray Place 1 spray into both nostrils daily after supper.   Lansoprazole (PREVACID PO) Take 1 tablet by mouth daily at 2 PM.   lidocaine (LIDODERM) 5 % Place 1 patch onto the skin as needed for pain.   loratadine (CLARITIN) 10 MG tablet Take 10 mg by mouth daily.   metoprolol succinate (TOPROL-XL) 50 MG 24 hr tablet TAKE 1 TABLET BY MOUTH ONCE DAILY WITH MEAL OR IMMEDIATELY FOLLOWING A MEAL.(DISCONTINUE DILTIAZEM-EDEA)   MIRALAX 17 GM/SCOOP powder as needed.   NON FORMULARY ALTERIL Sleep Aid   NUCYNTA 50 MG tablet Take 50 mg by mouth as needed.   Ophthalmic Irrigation  Solution (OCUSOFT EYE [employer] OP) Apply 1 application to eye at bedtime.   Oxymetazoline HCl (AFRIN NASAL SPRAY NA) Place 1 spray into the nose daily  at 2 PM. Every other week   Pyridoxine HCl (VITAMIN B-6 PO) Take by mouth daily.   terazosin (HYTRIN) 5 MG capsule Take 10 mg by mouth at bedtime.   [DISCONTINUED] losartan-hydrochlorothiazide (HYZAAR) 100-25 MG tablet Take 1 tablet by mouth every morning.   losartan-hydrochlorothiazide (HYZAAR) 100-25 MG tablet Take 1 tablet by mouth every morning.   No facility-administered encounter medications on file as of 03/26/2022.    Radiology:   Chest x-ray PA and lateral 09/01/2017: Mediastinum and hilar structures normal. Mild bibasilar subsegmental atelectasis. No pleural effusion or pneumothorax.  Cardiomegaly with normal pulmonary vascularity.  Degenerative changes thoracic spine. Lumbar spine fusion.  Cardiac Studies:   Treadmill exercise stress test 12/07/2014: Patient exercised for 9 minutes in Bruce stage III, achieving 10 METS workload.  There was no evidence of ischemia.  Normal blood pressure response.  Achieved 114% of MPHR.  PCV ECHOCARDIOGRAM COMPLETE 08/07/2021 : Left ventricle cavity is normal in size. Moderate concentric hypertrophy of the left ventricle. 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 Normal LV systolic function with EF 58%. Doppler evidence of grade I (impaired) diastolic dysfunction, normal LAP. Mild (Grade I) mitral regurgitation. Mild tricuspid regurgitation. Mild pulmonic regurgitation. The aortic root is dilated at 4.1 cm. IVC is not seen. Compared to previous study on 08/03/2020, mild MR and PI are new. No other significant change noted.   Lexiscan (with Mod Bruce protocol) Nuclear stress test 02/18/2022: Myocardial perfusion is normal. There was diaphragmatic attenuation noted in the inferior wall.  Overall LV systolic function is normal without regional wall motion abnormalities. Stress LV EF: 61%.  Normal ECG  stress. The heart rate response was consistent with Regadenoson. Patient achieved 87% of MPHR. No chest pain.  No previous exam available for comparison. Low risk.   EKG   EKG 02/12/2022: Normal sinus rhythm at rate of 86 bpm, left atrial enlargement, left axis deviation, left anterior fascicular block.  LVH.  No significant change from 08/16/2021.   Assessment     ICD-10-CM   1. Dyspnea on exertion  R06.09 Pro b natriuretic peptide (BNP)    Basic Metabolic Panel (BMET)    2. Bilateral leg edema  R60.0 Pro b natriuretic peptide (BNP)    Basic Metabolic Panel (BMET)    3. Primary hypertension  I10 losartan-hydrochlorothiazide (HYZAAR) 100-25 MG tablet    4. Aortic root dilatation (HCC)  I77.810 losartan-hydrochlorothiazide (HYZAAR) 100-25 MG tablet       Meds ordered this encounter  Medications   losartan-hydrochlorothiazide (HYZAAR) 100-25 MG tablet    Sig: Take 1 tablet by mouth every morning.    Dispense:  90 tablet    Refill:  3    Order Specific Question:   Supervising Provider    Answer:   Adrian Prows [[id number]]   Medications Discontinued During This Encounter  Medication Reason   losartan-hydrochlorothiazide (HYZAAR) 100-25 MG tablet Reorder    Recommendations:   Richard Williams  is a 81 y.o. Caucasian male with hypertension, mild aortic root dilatation, mild pectus excavatum, scoliosis, chronic dyspnea on exertion, GERD, lumbar disc disease, chronic elevated heart rate (sinus tachycardia).  Dyspnea on exertion Dyspnea on exertion has significantly improved since last visit.  He was seen by pulmonology and they are in were no concerns that dyspnea on exertion was of pulmonary etiology. Given that dyspnea has improved since starting losartan HCTZ feel that this was related to fluid volume overload and diastolic dysfunction.  Bilateral leg edema Leg edema has completely resolved since  last visit. He is tolerating losartan HCTZ without issues. We will recheck proBNP and  BMP.  Primary hypertension Blood pressure is currently under excellent control. Discussed importance of low-sodium diet.  He is currently exercising daily and I have congratulated him on this.  Follow-up in 6 months or sooner if needed.    Ernst Spell, AGNP-C 03/26/2022, 3:45 PM Office: [phone] Fax: [phone] Pager: [phone]

## 2022-05-13 ENCOUNTER — Ambulatory Visit: Payer: Medicare Other | Admitting: Internal Medicine

## 2022-08-05 ENCOUNTER — Ambulatory Visit: Payer: Medicare Other

## 2022-08-05 DIAGNOSIS — I77810 Thoracic aortic ectasia: Secondary | ICD-10-CM

## 2022-08-14 ENCOUNTER — Ambulatory Visit: Payer: Medicare Other | Admitting: Cardiology

## 2022-08-14 ENCOUNTER — Encounter: Payer: Self-pay | Admitting: Cardiology

## 2022-08-14 VITALS — BP 121/77 | HR 96 | Ht 71.0 in | Wt 213.0 lb

## 2022-08-14 DIAGNOSIS — I77810 Thoracic aortic ectasia: Secondary | ICD-10-CM

## 2022-08-14 DIAGNOSIS — R0609 Other forms of dyspnea: Secondary | ICD-10-CM

## 2022-08-14 DIAGNOSIS — I10 Essential (primary) hypertension: Secondary | ICD-10-CM

## 2022-08-14 MED ORDER — METOPROLOL SUCCINATE ER 50 MG PO TB24: ORAL_TABLET | ORAL | 0 refills | Status: DC

## 2022-08-14 MED ORDER — LOSARTAN POTASSIUM-HCTZ 100-25 MG PO TABS: 1.0000 | ORAL_TABLET | ORAL | 3 refills | Status: DC

## 2022-08-14 NOTE — Progress Notes (Signed)
Primary Physician/Referring:  Burton Apley, MD  Patient ID: Richard Williams, male    DOB: 11/11/1940, 82 y.o.   MRN: [id number]  Chief Complaint  Patient presents with   Aortic root dilation   Follow-up    1 year   HPI:    Richard Williams  is a 82 y.o. Caucasian male with hypertension, mild aortic root dilatation, mild pectus excavatum, scoliosis, chronic dyspnea on exertion, GERD, lumbar disc disease, chronic elevated heart rate (sinus tachycardia).  Due to worsening dyspnea, leg edema, I started him on losartan HCT and for underlying resting sinus tachycardia, he was also started on metoprolol and since initiation of these medications, patient states that he is feeling the best he has in quite a while.  He has not had any further dyspnea, no leg edema.  Remains asymptomatic today.   Past Medical History:  Diagnosis Date   Anxiety    Arthritis    BPH (benign prostatic hypertrophy)    on meds   Depression    Dysrhythmia    1 episode of AF but never came back   Enlarged aorta    GERD (gastroesophageal reflux disease)    on meds   Hyperlipidemia    Hypertension    Neuromuscular disorder     right groin with numbness states he had back surgery in hopes to correct this, but still there   Peripheral vascular disease    surface blood clot x1 top of left foot   Pneumonia    years ago 5,    Family History  Problem Relation Age of Onset   Stroke Mother    Heart Problems Father    Macular degeneration Sister    Healthy Daughter    Social History   Tobacco Use   Smoking status: Never    Passive exposure: Never   Smokeless tobacco: Never  Substance Use Topics   Alcohol use: Yes    Comment: once or twice weekly   Marital Status: Married  ROS  Review of Systems  Cardiovascular:  Negative for chest pain, dyspnea on exertion and leg swelling.   Objective      08/14/2022   10:44 AM 03/26/2022    3:10 PM 03/08/2022    2:45 PM  Vitals with BMI  Height 5\' 11"  5\' 11"   5\' 11"   Weight 213 lbs 218 lbs 13 oz 217 lbs 10 oz  BMI 29.72 30.53 30.36  Systolic 121 118 371  Diastolic 77 75 62  Pulse 96 82 90    Physical Exam Neck:     Vascular: No JVD.  Cardiovascular:     Rate and Rhythm: Normal rate and regular rhythm.     Pulses: Intact distal pulses.     Heart sounds: Normal heart sounds. No murmur heard.    No gallop.  Pulmonary:     Effort: Pulmonary effort is normal.     Breath sounds: Normal breath sounds.  Abdominal:     General: Bowel sounds are normal.     Palpations: Abdomen is soft.  Musculoskeletal:     Right lower leg: No edema.     Left lower leg: No edema.    Laboratory examination:  ProBNP    Component Value Date/Time   PROBNP 166 02/12/2022 1200    External labs:   Labs 09/13/2021:  S. Glucose 77 mg, BUN 23, creatinine 1.12, EGFR 67.  mL.  Labs 07/09/2021:  Hb 15.2/HCT 44.3, platelets 200, normal indicis.  Total cholesterol 138, triglycerides  90, HDL 41, LDL 80.  Serum glucose 92 mg, BUN 21, creatinine 1.34, EGFR 54.  mL.  Potassium 4.2, sodium 137, LFTs normal.  Radiology:   Chest x-ray PA and lateral 02/13/2022: 1. Stable cardiomegaly. No pulmonary venous congestion. 2. Low lung volumes. Mild left base infiltrate cannot be excluded. 3. Probable hiatal hernia.  Cardiac Studies:  Abdominal Aortic Duplex 03/03/2014: Negative for abdominal aortic aneurysm.   Lexiscan (with Mod Bruce protocol) Nuclear stress test 02/18/2022: Myocardial perfusion is normal. There was diaphragmatic attenuation noted in the inferior wall.  Overall LV systolic function is normal without regional wall motion abnormalities. Stress LV EF: 61%.  Normal ECG stress. The heart rate response was consistent with Regadenoson. Patient achieved 87% of MPHR. No chest pain.  No previous exam available for comparison. Low risk.   PCV ECHOCARDIOGRAM COMPLETE 08/05/2022  Narrative Echocardiogram 08/05/2022: Normal LV systolic function with visual EF  55-60%. Left ventricle cavity is normal in size. Mild concentric hypertrophy of the left ventricle. Normal global wall motion. Doppler evidence of grade I (impaired) diastolic dysfunction, normal LAP. Calculated EF 62%. Left atrial cavity is slightly dilated. Structurally normal tricuspid valve.  Mild tricuspid regurgitation. No evidence of pulmonary hypertension. The aortic root is mildly dilated at 4.2 cm SoV and 3.8 cm at STJ. Mildly dilated ascending aorta at 4.0 cm. No significant change compared to 08/2021. Overall stable from 2020.   EKG   EKG 08/14/2022: Normal sinus rhythm at rate of 83 bpm, left atrial enlargement, left axis deviation, left anterior fascicular block.  IVCD, borderline criteria for LVH.  No evidence of ischemia.  Compared to 12/13/2021, no significant change.  Medications and allergies   Allergies  Allergen Reactions   Codeine Hives    Other Reaction(s): Unknown   Other Itching and Other (See Comments)    OPIODS--All over itch  Including Laudanum   Azithromycin     Other Reaction(s): cold sores   Hydrocodone Other (See Comments)   Lorazepam     Pt stated, "I had a side effect -- I could not control my urination"   Penicillins Other (See Comments)   Amoxicillin Other (See Comments)    Cold sores  Has patient had a PCN reaction causing immediate rash, facial/tongue/throat swelling, SOB or lightheadedness with hypotension: No Has patient had a PCN reaction causing severe rash involving mucus membranes or skin necrosis: Yes Has patient had a PCN reaction that required hospitalization: No Has patient had a PCN reaction occurring within the last 10 years: No If all of the above answers are "NO", then may proceed with Cephalosporin use.     Current Outpatient Medications:    Calcium Carbonate-Vitamin D (CALTRATE 600+D PO), Take 1 tablet by mouth daily., Disp: , Rfl:    esomeprazole (NEXIUM) 40 MG capsule, Take 40 mg by mouth daily., Disp: , Rfl:    ezetimibe  (ZETIA) 10 MG tablet, Take 10 mg by mouth. M, W, Friday only, Disp: , Rfl:    fluticasone (FLONASE) 50 MCG/ACT nasal spray, Place 1 spray into both nostrils daily after supper., Disp: , Rfl:    Lansoprazole (PREVACID PO), Take 1 tablet by mouth daily at 2 PM., Disp: , Rfl:    lidocaine (LIDODERM) 5 %, Place 1 patch onto the skin as needed for pain., Disp: , Rfl:    loratadine (CLARITIN) 10 MG tablet, Take 10 mg by mouth daily., Disp: , Rfl:    MIRALAX 17 GM/SCOOP powder, as needed., Disp: , Rfl:  Multiple Vitamins-Minerals (CENTRUM SILVER 50+MEN PO), Take 1 tablet by mouth daily at 2 PM., Disp: , Rfl:    NON FORMULARY, ALTERIL Sleep Aid, Disp: , Rfl:    NUCYNTA 50 MG tablet, Take 50 mg by mouth as needed., Disp: , Rfl:    Ophthalmic Irrigation Solution (OCUSOFT EYE WASH OP), Apply 1 application to eye at bedtime., Disp: , Rfl:    Oxymetazoline HCl (AFRIN NASAL SPRAY NA), Place 1 spray into the nose daily at 2 PM. Every other week, Disp: , Rfl:    Pyridoxine HCl (VITAMIN B-6 PO), Take by mouth daily., Disp: , Rfl:    terazosin (HYTRIN) 5 MG capsule, Take 10 mg by mouth at bedtime., Disp: , Rfl:    losartan-hydrochlorothiazide (HYZAAR) 100-25 MG tablet, Take 1 tablet by mouth every morning., Disp: 90 tablet, Rfl: 3   metoprolol succinate (TOPROL-XL) 50 MG 24 hr tablet, TAKE 1 TABLET BY MOUTH ONCE DAILY WITH MEAL OR IMMEDIATELY FOLLOWING A MEAL.(DISCONTINUE DILTIAZEM-EDEA), Disp: 90 tablet, Rfl: 0   Assessment     ICD-10-CM   1. Aortic root dilatation  I77.810 EKG 12-Lead    losartan-hydrochlorothiazide (HYZAAR) 100-25 MG tablet    2. Primary hypertension  I10 losartan-hydrochlorothiazide (HYZAAR) 100-25 MG tablet    metoprolol succinate (TOPROL-XL) 50 MG 24 hr tablet    3. Dyspnea on exertion  R06.09        Meds ordered this encounter  Medications   losartan-hydrochlorothiazide (HYZAAR) 100-25 MG tablet    Sig: Take 1 tablet by mouth every morning.    Dispense:  90 tablet    Refill:   3   metoprolol succinate (TOPROL-XL) 50 MG 24 hr tablet    Sig: TAKE 1 TABLET BY MOUTH ONCE DAILY WITH MEAL OR IMMEDIATELY FOLLOWING A MEAL.(DISCONTINUE DILTIAZEM-EDEA)    Dispense:  90 tablet    Refill:  0   Medications Discontinued During This Encounter  Medication Reason   metoprolol succinate (TOPROL-XL) 50 MG 24 hr tablet Reorder   losartan-hydrochlorothiazide (HYZAAR) 100-25 MG tablet Reorder     Recommendations:   Richard Williams  is a 82 y.o. Caucasian male with hypertension, mild aortic root dilatation, mild pectus excavatum, scoliosis, chronic dyspnea on exertion, GERD, lumbar disc disease, chronic elevated heart rate (sinus tachycardia).  1. Aortic root dilatation I reviewed the results of the echocardiogram, I am 5 point back all the way up to 2007 where his aortic root dilatation was around 3.7 cm, overall he has remained stable.  Advised him that instead of doing echocardiogram on annual basis that we could do once every other year.  - EKG 12-Lead - losartan-hydrochlorothiazide (HYZAAR) 100-25 MG tablet; Take 1 tablet by mouth every morning.  Dispense: 90 tablet; Refill: 3  2. Primary hypertension Patient excellent control with the combination of losartan HCT and also metoprolol.  Continue the same for now.  He used to have some resting sinus tachycardia before, with addition of metoprolol, he has done well and remains completely asymptomatic.  - losartan-hydrochlorothiazide (HYZAAR) 100-25 MG tablet; Take 1 tablet by mouth every morning.  Dispense: 90 tablet; Refill: 3 - metoprolol succinate (TOPROL-XL) 50 MG 24 hr tablet; TAKE 1 TABLET BY MOUTH ONCE DAILY WITH MEAL OR IMMEDIATELY FOLLOWING A MEAL.(DISCONTINUE DILTIAZEM-EDEA)  Dispense: 90 tablet; Refill: 0  3. Dyspnea on exertion He is to have significant amount of dyspnea before with exertion activity, today he states that he feels the best he has in quite a while.  He remains asymptomatic.  I  will see him back in 2  years for follow-up.     Yates Decamp, AGNP-C 08/14/2022, 11:23 AM Office: [phone] Fax: [phone] Pager: [phone]

## 2022-08-19 ENCOUNTER — Ambulatory Visit: Payer: Medicare Other | Admitting: Cardiology

## 2022-12-26 ENCOUNTER — Ambulatory Visit
Admission: RE | Admit: 2022-12-26 | Discharge: 2022-12-26 | Disposition: A | Discharge status: Home / Self Care | Payer: Medicare Other | Source: Ambulatory Visit | Attending: Gastroenterology | Admitting: Gastroenterology

## 2022-12-26 ENCOUNTER — Other Ambulatory Visit: Payer: Self-pay | Admitting: Gastroenterology

## 2022-12-26 DIAGNOSIS — K5900 Constipation, unspecified: Secondary | ICD-10-CM

## 2023-02-05 NOTE — Progress Notes (Signed)
Office Visit Note  Patient: Richard Williams             Date of Birth: Dec 27, 1940           MRN: [id number]             PCP: Burton Apley, MD Referring: Burton Apley, MD Visit Date: 02/18/2023 Occupation: @GUAROCC @  Subjective:  Numbness in both hands and pain in feet   History of Present Illness: Richard Williams is a 82 y.o. male with osteoarthritis and degenerative disc disease.  He states his right knee joint pain has improved since he has modified his biking.  He continues to have some pain and stiffness in his hands.  He states recently has been experiencing more numbness in his hands especially when he is playing games on his cell phone.  His left trigger thumb has completely resolved after the cortisone injection.  He has been having discomfort in his feet and would like to go to [facility] for orthotics.    Activities of Daily Living:  Patient reports morning stiffness for 20-30 minutes.   Patient Denies nocturnal pain.  Difficulty dressing/grooming: Denies Difficulty climbing stairs: Denies Difficulty getting out of chair: Denies Difficulty using hands for taps, buttons, cutlery, and/or writing: Denies  Review of Systems  Constitutional:  Negative for fatigue.  HENT:  Positive for mouth dryness. Negative for mouth sores.   Eyes:  Negative for dryness.  Respiratory:  Negative for shortness of breath.   Cardiovascular:  Negative for chest pain and palpitations.  Gastrointestinal:  Positive for constipation. Negative for blood in stool and diarrhea.  Endocrine: Negative for increased urination.  Genitourinary:  Negative for involuntary urination.  Musculoskeletal:  Positive for gait problem, muscle weakness and morning stiffness. Negative for joint pain, joint pain, joint swelling, myalgias, muscle tenderness and myalgias.  Skin:  Positive for sensitivity to sunlight. Negative for color change, rash and hair loss.  Allergic/Immunologic: Negative for susceptible to  infections.  Neurological:  Negative for dizziness and headaches.  Hematological:  Negative for swollen glands.  Psychiatric/Behavioral:  Positive for sleep disturbance. Negative for depressed mood. The patient is nervous/anxious.     PMFS History:  Patient Active Problem List   Diagnosis Date Noted   DOE (dyspnea on exertion) 03/08/2022   Primary osteoarthritis of both hands 02/01/2019   S/P lumbar spinal fusion 03/20/2017   Recurrent left inguinal hernia 11/13/2015   S/P lumbar laminectomy 02/10/2014    Past Medical History:  Diagnosis Date   Anxiety    Arthritis    BPH (benign prostatic hypertrophy)    on meds   Depression    Dysrhythmia    1 episode of AF but never came back   Enlarged aorta (HCC)    GERD (gastroesophageal reflux disease)    on meds   Hyperlipidemia    Hypertension    Neuromuscular disorder (HCC)     right groin with numbness states he had back surgery in hopes to correct this, but still there   Peripheral vascular disease (HCC)    surface blood clot x1 top of left foot   Pneumonia    years ago 5,    Family History  Problem Relation Age of Onset   Stroke Mother    Heart Problems Father    Macular degeneration Sister    Healthy Daughter    Past Surgical History:  Procedure Laterality Date   BACK SURGERY     EYE SURGERY Bilateral    cataracts  HERNIA REPAIR     INGUINAL HERNIA REPAIR Left 11/13/2015   Procedure: OPEN REPAIR LEFT INGUINAL HERNIA;  Surgeon: Glenna Fellows, MD;  Location: [facility] OR;  Service: General;  Laterality: Left;   LAMINECTOMY WITH POSTERIOR LATERAL ARTHRODESIS LEVEL 2 N/A 03/20/2017   Procedure: Posterior Lateral Fusion - L1 - L3, segmental fixation L1-3;  Surgeon: Tia Alert, MD;  Location: [facility] OR;  Service: Neurosurgery;  Laterality: N/A;   LUMBAR LAMINECTOMY/DECOMPRESSION MICRODISCECTOMY N/A 02/10/2014   Procedure: Lumbar One to Two, Lumbar Two to Three Lumbar laminectomy for stenosis/epidural mass;  Surgeon: Tia Alert, MD;  Location: [facility] ORS;  Service: Neurosurgery;  Laterality: N/A;  L1-2 L2-3 Lumbar laminectomy for stenosis/epidural mass   TONSILLECTOMY     Social History   Social History Narrative   Not on file   Immunization History  Administered Date(s) Administered   Influenza, High Dose Seasonal PF 02/05/2019   Moderna Covid-19 Vaccine Bivalent Booster 21yrs & up 02/15/2021   Moderna Sars-Covid-2 Vaccination 05/18/2019, 06/28/2019, 02/28/2020, 11/10/2020   Zoster Recombinant(Shingrix) 07/16/2017     Objective: Vital Signs: BP 123/85 (BP Location: Left Arm, Patient Position: Sitting, Cuff Size: Small)   Pulse 92   Resp 12   Ht 5\' 11"  (1.803 m)   Wt 214 lb (97.1 kg)   BMI 29.85 kg/m    Physical Exam Vitals and nursing note reviewed.  Constitutional:      Appearance: He is well-developed.  HENT:     Head: Normocephalic and atraumatic.  Eyes:     Conjunctiva/sclera: Conjunctivae normal.     Pupils: Pupils are equal, round, and reactive to light.  Cardiovascular:     Rate and Rhythm: Normal rate and regular rhythm.     Heart sounds: Normal heart sounds.  Pulmonary:     Effort: Pulmonary effort is normal.     Breath sounds: Normal breath sounds.  Abdominal:     General: Bowel sounds are normal.     Palpations: Abdomen is soft.  Musculoskeletal:     Cervical back: Normal range of motion and neck supple.  Skin:    General: Skin is warm and dry.     Capillary Refill: Capillary refill takes less than 2 seconds.  Neurological:     Mental Status: He is alert and oriented to person, place, and time.  Psychiatric:        Behavior: Behavior normal.      Musculoskeletal Exam: Cervical, thoracic and lumbar spine with good range of motion..  Thoracic kyphosis was noted.  Shoulder joints, elbow joints, wrist joints were in good range of motion.  Phalen's was positive on the right hand.  He had bilateral PIP and DIP thickening with no synovitis.  Hip joints and knee joints with  good range of motion with no warmth swelling or effusion.  There was no tenderness over ankles or MTPs.  CDAI Exam: CDAI Score: -- Patient Global: --; Provider Global: -- Swollen: --; Tender: -- Joint Exam 02/18/2023   No joint exam has been documented for this visit   There is currently no information documented on the homunculus. Go to the Rheumatology activity and complete the homunculus joint exam.  Investigation: No additional findings.  Imaging: No results found.  Recent Labs: Lab Results  Component Value Date   WBC 9.0 03/11/2017   HGB 14.0 03/11/2017   PLT 183 03/11/2017   NA 138 03/11/2017   K 4.0 03/11/2017   CL 106 03/11/2017   CO2 24 03/11/2017  GLUCOSE 125 (H) 03/11/2017   BUN 24 (H) 03/11/2017   CREATININE 1.29 (H) 03/11/2017   CALCIUM 9.2 03/11/2017   GFRAA >60 03/11/2017    Speciality Comments: No specialty comments available.  Procedures:  No procedures performed Allergies: Codeine, Other, Azithromycin, Hydrocodone, Lorazepam, Penicillins, and Amoxicillin   Assessment / Plan:     Visit Diagnoses: Primary osteoarthritis of both hands-he had bilateral PIP and DIP thickening with no synovitis.  Joint protection was discussed.  Carpal tunnel syndrome, right upper limb-he has been having symptoms of carpal tunnel syndrome in bilateral hands more prominent in the right hand.  Continued concern for carpal tunnel syndrome was provided.  He was advised to use carpal tunnel braces.  He bought the braces but he has not started using them in a regular basis.  I also discussed possible cortisone injection in the future for the right hand.  He declined nerve conduction velocity and EMG.  Trigger thumb, left thumb - Injected on December 01, 2018.  Resolved after the cortisone injection.  Chronic pain of right knee-patient had an injury to his right knee joint in the past.  He states he has intermittent discomfort specially when he is biking.  He states he flared his  right knee few days back and after using topical Voltaren gel his symptoms improved.  Primary osteoarthritis of both feet -he continues to have some discomfort in his feet.  He has lost fat pad and has been experiencing metatarsalgia.  Per patient's request we will refer him to sports medicine for evaluation and orthotics.  Plan: Ambulatory referral to Orthopedic Surgery  S/P lumbar spinal fusion - History of lumbar spine fusion x2.  He continues to have intermittent discomfort.  He ambulates with the help of a cane.  Essential hypertension-blood pressure was normal today at 123/85.  Anxiety and depression  History of hyperlipidemia  Osteoporosis screening - DEXA 08/15/2020 T-score: 0.2, BMD: 0.956 left femoral neck.  Orders: Orders Placed This Encounter  Procedures   Ambulatory referral to Orthopedic Surgery   No orders of the defined types were placed in this encounter.    Follow-Up Instructions: Return in about 6 months (around 08/19/2023) for Osteoarthritis.   Pollyann Savoy, MD  Note - This record has been created using Animal nutritionist.  Chart creation errors have been sought, but may not always  have been located. Such creation errors do not reflect on  the standard of medical care.

## 2023-02-18 ENCOUNTER — Encounter: Payer: Self-pay | Admitting: Rheumatology

## 2023-02-18 ENCOUNTER — Ambulatory Visit: Payer: Medicare Other | Attending: Rheumatology | Admitting: Rheumatology

## 2023-02-18 VITALS — BP 123/85 | HR 92 | Resp 12 | Ht 71.0 in | Wt 214.0 lb

## 2023-02-18 DIAGNOSIS — Z8639 Personal history of other endocrine, nutritional and metabolic disease: Secondary | ICD-10-CM

## 2023-02-18 DIAGNOSIS — M65312 Trigger thumb, left thumb: Secondary | ICD-10-CM | POA: Diagnosis not present

## 2023-02-18 DIAGNOSIS — Z13820 Encounter for screening for osteoporosis: Secondary | ICD-10-CM

## 2023-02-18 DIAGNOSIS — M19072 Primary osteoarthritis, left ankle and foot: Secondary | ICD-10-CM

## 2023-02-18 DIAGNOSIS — F419 Anxiety disorder, unspecified: Secondary | ICD-10-CM

## 2023-02-18 DIAGNOSIS — I10 Essential (primary) hypertension: Secondary | ICD-10-CM

## 2023-02-18 DIAGNOSIS — M25561 Pain in right knee: Secondary | ICD-10-CM

## 2023-02-18 DIAGNOSIS — M19042 Primary osteoarthritis, left hand: Secondary | ICD-10-CM

## 2023-02-18 DIAGNOSIS — G5601 Carpal tunnel syndrome, right upper limb: Secondary | ICD-10-CM

## 2023-02-18 DIAGNOSIS — G8929 Other chronic pain: Secondary | ICD-10-CM

## 2023-02-18 DIAGNOSIS — M19041 Primary osteoarthritis, right hand: Secondary | ICD-10-CM

## 2023-02-18 DIAGNOSIS — F32A Depression, unspecified: Secondary | ICD-10-CM

## 2023-02-18 DIAGNOSIS — Z981 Arthrodesis status: Secondary | ICD-10-CM

## 2023-02-18 DIAGNOSIS — M19071 Primary osteoarthritis, right ankle and foot: Secondary | ICD-10-CM

## 2023-02-21 ENCOUNTER — Telehealth: Payer: Self-pay | Admitting: Rheumatology

## 2023-02-21 NOTE — Telephone Encounter (Signed)
Pt is stating that the referral was sent to the wrong place and wrong doctor. Pt states he never asked for Dr. Rodolph Bong. Pt states that they discussed seeing the podiatrist. Pt is very worried about being referred to an orthopedic surgeon and would like reassurance that this was a mistake.

## 2023-02-21 NOTE — Telephone Encounter (Signed)
Patient advised we placed the referral to Dr. Rodolph Bong as requested. Advised patient Dr. Penni Bombard is at [facility].

## 2023-03-21 ENCOUNTER — Ambulatory Visit: Payer: Medicare Other | Attending: Sports Medicine | Admitting: Physical Therapy

## 2023-03-21 ENCOUNTER — Telehealth: Payer: Self-pay | Admitting: Physical Therapy

## 2023-03-21 ENCOUNTER — Encounter: Payer: Self-pay | Admitting: Physical Therapy

## 2023-03-21 ENCOUNTER — Other Ambulatory Visit: Payer: Self-pay

## 2023-03-21 VITALS — BP 132/74 | HR 103

## 2023-03-21 DIAGNOSIS — R42 Dizziness and giddiness: Secondary | ICD-10-CM | POA: Diagnosis present

## 2023-03-21 DIAGNOSIS — M6281 Muscle weakness (generalized): Secondary | ICD-10-CM | POA: Diagnosis present

## 2023-03-21 DIAGNOSIS — M25571 Pain in right ankle and joints of right foot: Secondary | ICD-10-CM | POA: Insufficient documentation

## 2023-03-21 DIAGNOSIS — R2689 Other abnormalities of gait and mobility: Secondary | ICD-10-CM | POA: Diagnosis present

## 2023-03-21 DIAGNOSIS — R2681 Unsteadiness on feet: Secondary | ICD-10-CM | POA: Insufficient documentation

## 2023-03-21 DIAGNOSIS — M25572 Pain in left ankle and joints of left foot: Secondary | ICD-10-CM | POA: Diagnosis present

## 2023-03-21 NOTE — Therapy (Signed)
OUTPATIENT PHYSICAL THERAPY NEURO EVALUATION   Patient Name: Richard Williams MRN: [id number] DOB:25-Jan-1941, 82 y.o., male Today's Date: 03/27/2023   PCP: Burton Apley, MD REFERRING PROVIDER: Delfin Gant, MD  END OF SESSION:   PT End of Session - 03/21/23 1119     Visit Number 1    Number of Visits 9   8 + eval   Date for PT Re-Evaluation 05/23/23   pushed out due to scheduling delay   Authorization Type [employer] MEDICARE    Progress Note Due on Visit 10    PT Start Time 1104    PT Stop Time 1200    PT Time Calculation (min) 56 min    Equipment Utilized During Treatment Gait belt    Behavior During Therapy Agitated;WFL for tasks assessed/performed             Past Medical History:  Diagnosis Date   Anxiety    Arthritis    BPH (benign prostatic hypertrophy)    on meds   Depression    Dysrhythmia    1 episode of AF but never came back   Enlarged aorta (HCC)    GERD (gastroesophageal reflux disease)    on meds   Hyperlipidemia    Hypertension    Neuromuscular disorder (HCC)     right groin with numbness states he had back surgery in hopes to correct this, but still there   Peripheral vascular disease (HCC)    surface blood clot x1 top of left foot   Pneumonia    years ago 5,   Past Surgical History:  Procedure Laterality Date   BACK SURGERY     EYE SURGERY Bilateral    cataracts   HERNIA REPAIR     INGUINAL HERNIA REPAIR Left 11/13/2015   Procedure: OPEN REPAIR LEFT INGUINAL HERNIA;  Surgeon: Glenna Fellows, MD;  Location: [facility] OR;  Service: General;  Laterality: Left;   LAMINECTOMY WITH POSTERIOR LATERAL ARTHRODESIS LEVEL 2 N/A 03/20/2017   Procedure: Posterior Lateral Fusion - L1 - L3, segmental fixation L1-3;  Surgeon: Tia Alert, MD;  Location: [facility] OR;  Service: Neurosurgery;  Laterality: N/A;   LUMBAR LAMINECTOMY/DECOMPRESSION MICRODISCECTOMY N/A 02/10/2014   Procedure: Lumbar One to Two, Lumbar Two to Three Lumbar laminectomy for  stenosis/epidural mass;  Surgeon: Tia Alert, MD;  Location: [facility] NEURO ORS;  Service: Neurosurgery;  Laterality: N/A;  L1-2 L2-3 Lumbar laminectomy for stenosis/epidural mass   TONSILLECTOMY     Patient Active Problem List   Diagnosis Date Noted   DOE (dyspnea on exertion) 03/08/2022   Primary osteoarthritis of both hands 02/01/2019   S/P lumbar spinal fusion 03/20/2017   Recurrent left inguinal hernia 11/13/2015   S/P lumbar laminectomy 02/10/2014    ONSET DATE: Imbalance since surgery in 2018  REFERRING DIAG: M79.671 (ICD-10-CM) - Pain in right foot M79.672 (ICD-10-CM) - Pain in left foot R42 (ICD-10-CM) - Dizziness and giddiness  THERAPY DIAG:  Pain in right ankle and joints of right foot - Plan: PT plan of care cert/re-cert  Pain in left ankle and joints of left foot - Plan: PT plan of care cert/re-cert  Other abnormalities of gait and mobility - Plan: PT plan of care cert/re-cert  Muscle weakness (generalized) - Plan: PT plan of care cert/re-cert  Unsteadiness on feet - Plan: PT plan of care cert/re-cert  Dizziness and giddiness  Rationale for Evaluation and Treatment: Rehabilitation  SUBJECTIVE:  SUBJECTIVE STATEMENT: Pt states he was under the impression he was sent here for dizziness following telling his doctor that he sometimes gets dizzy when turning left to right in bed.  He states he feels off balance because he has scoliosis that he feels has worsened since his second back surgery.  He also reports the MD told him his left foot is flatter than the right and this is contributing to his left lurch when he walks.  He presents as ambulatory with a trekking pole today.  He further states the MD did not want to pursue orthotics at this time. Pt accompanied by: self - pt  drives  PERTINENT HISTORY: Lumbar laminectomy x2 L1-L3 2015 and 2018, recurrent left inguinal hernia  PAIN:  Are you having pain? No  PRECAUTIONS: Fall  RED FLAGS: None   WEIGHT BEARING RESTRICTIONS: No  FALLS: Has patient fallen in last 6 months? No  LIVING ENVIRONMENT: Lives with: lives with their spouse Lives in: House/apartment Stairs: Yes: External: 3 steps; on right going up and garage entrance is primary entrance Has following equipment at home: Shower bench, Grab bars, and Hurrycane and trekking pole  PLOF: Independent and Uses AD (cane or trekking pole) most of the time for safety  PATIENT GOALS: none  OBJECTIVE:  Note: Objective measures were completed at Evaluation unless otherwise noted.  DIAGNOSTIC FINDINGS: No recent relevant imaging.  COGNITION: Overall cognitive status: Within functional limits for tasks assessed   SENSATION: WFL  COORDINATION: BLE RAMS:  WFL Bilateral Heel-to- shin: impaired: LLE less coordinated than right  EDEMA:  None significant in BLE  MUSCLE TONE: None noted during functional assessment.  POSTURE: forward head, weight shift right, and left shoulder lower than right  LOWER EXTREMITY ROM:     Active  Right Eval Left Eval  Hip flexion Grossly WFL  Hip extension   Hip abduction   Hip adduction   Hip internal rotation   Hip external rotation   Knee flexion   Knee extension   Ankle dorsiflexion   Ankle plantarflexion    Ankle inversion    Ankle eversion     (Blank rows = not tested)  LOWER EXTREMITY MMT:    MMT Right Eval Left Eval  Hip flexion 4+/5 4/5  Hip extension    Hip abduction    Hip adduction    Hip internal rotation    Hip external rotation    Knee flexion    Knee extension 5/5 5/5  Ankle dorsiflexion 5/5 5/5  Ankle plantarflexion    Ankle inversion    Ankle eversion    (Blank rows = not tested)  BED MOBILITY:  Pt independent, but reports pain rolling to left side due to  surgery.  TRANSFERS: Assistive device utilized:  Trekking pole   Sit to stand: Modified independence Stand to sit: Modified independence Chair to chair: Modified independence  GAIT: Gait pattern: lateral lean- Right and trunk flexed Distance walked: various clinic distances Assistive device utilized:  trekking pole Level of assistance: Modified independence Comments: No overt drift, LOB, or poor foot placement noted.  FUNCTIONAL TESTS:  5 times sit to stand: 11.75 seconds w/ BUE support Functional gait assessment: To be assessed. MCTSIB: Condition 1: Avg of 3 trials: 30 sec, Condition 2: Avg of 3 trials: 30 sec, Condition 3: Avg of 3 trials: 20.01 sec, Condition 4: Avg of 3 trials: 1.59 sec, and Total Score: 81.6/120  PATIENT SURVEYS:  ABC scale 95.63%  TODAY'S TREATMENT:  DATE: N/A - eval only    PATIENT EDUCATION: Education details: PT POC, assessments used and to be used, and goals to be set.  Obtaining referral for BPPV assessment. Person educated: Patient Education method: Explanation Education comprehension: verbalized understanding and needs further education  HOME EXERCISE PROGRAM: To be established.  GOALS: Goals reviewed with patient? Yes  SHORT TERM GOALS: Target date: 04/18/2023  Pt will be independent and compliant with balance and strength HEP in order to maintain functional progress and improve mobility. Baseline:  To be established. Goal status: INITIAL  2.  BPPV to be assessed with treatment provided as indicated. Baseline: To be assessed. Goal status: INITIAL  3.  Pt will decrease 5xSTS to <10 seconds w/o UE support in order to demonstrate decreased risk for falls and improved functional bilateral LE strength and power. Baseline: 11.75 seconds w/ BUE support Goal status: INITIAL  4.  Pt will improve condition 3 of mCTSIB to  >/=30 seconds in order to demonstrate improved balance strategies and static stance. Baseline: 20.01 seconds average Goal status: INITIAL  LONG TERM GOALS: Target date: 05/16/2023  Pt will improve condition 4 of mCTSIB to >/=10 seconds in order to demonstrate improved balance strategies and static stance. Baseline: 1.59 seconds average Goal status: INITIAL  2.  FGA to be assessed w/ goal set as appropriate. Baseline:  Goal status: INITIAL  ASSESSMENT:  CLINICAL IMPRESSION: Patient is an 82 y.o. male who was seen today for physical therapy evaluation and treatment for bilateral foot pain and imbalance.  Pt has a significant PMH of Lumbar laminectomy x2 L1-L3 2015 and 2018 and recurrent left inguinal hernia.  Identified impairments include decreased proximal LE strength, decreased LLE coordination, and static imbalance.  Evaluation via the following assessment tools: 5xSTS and mCTSIB indicate fall risk.  PT to ask for referral codes to supports further dizziness assessment as pt endorses this with left to right rolling.  He would benefit from skilled PT to address impairments as noted and progress towards long term goals.  OBJECTIVE IMPAIRMENTS: decreased balance, decreased coordination, decreased endurance, decreased knowledge of use of DME, difficulty walking, decreased strength, and postural dysfunction.   ACTIVITY LIMITATIONS: carrying, lifting, bending, stairs, and locomotion level  PARTICIPATION LIMITATIONS: community activity and yard work  PERSONAL FACTORS: Age, Fitness, Past/current experiences, Time since onset of injury/illness/exacerbation, and 1 comorbidity: lumbar laminectomy/fusion x2  are also affecting patient's functional outcome.   REHAB POTENTIAL: Excellent  CLINICAL DECISION MAKING: Evolving/moderate complexity  EVALUATION COMPLEXITY: Moderate  PLAN:  PT FREQUENCY: 1x/week (Pt preference)  PT DURATION: 8 weeks  PLANNED INTERVENTIONS: 97164- PT Re-evaluation,  97110-Therapeutic exercises, 97530- Therapeutic activity, [id number]- Neuromuscular re-education, 97535- Self Care, 14782- Manual therapy, [phone]- Gait training, [phone]- Canalith repositioning, Patient/Family education, Balance training, Stair training, Vestibular training, and DME instructions  PLAN FOR NEXT SESSION: Assess FGA - set goal as appropriate.  Assess BPPV and treat as indicated (Dizziness rolling left to right).  Initiate strength, balance, vestibular HEP as needed.  Sadie Haber, PT, DPT 03/27/2023, 1:46 PM

## 2023-03-21 NOTE — Telephone Encounter (Signed)
Initial call to [facility] on 11/15, left message regarding referral with male receptionist.   No updated referral 11/20 - Second call placed 11/20 regarding ICD 10 code correction on referral - requested gait abnormality or imbalance AND dizziness for further patient assessment at 11/22 appt.  Spoke with administrator, Thayer Ohm, who was to relay need for referral to Dr. Rodolph Bong.  Camille Bal, PT, DPT

## 2023-03-27 NOTE — Addendum Note (Signed)
Addended by: Camille Bal B on: 03/27/2023 01:49 PM   Modules accepted: Orders

## 2023-03-28 ENCOUNTER — Ambulatory Visit: Payer: Medicare Other

## 2023-03-28 DIAGNOSIS — M25571 Pain in right ankle and joints of right foot: Secondary | ICD-10-CM | POA: Diagnosis not present

## 2023-03-28 DIAGNOSIS — R42 Dizziness and giddiness: Secondary | ICD-10-CM

## 2023-03-28 DIAGNOSIS — R2681 Unsteadiness on feet: Secondary | ICD-10-CM

## 2023-03-28 NOTE — Therapy (Addendum)
OUTPATIENT PHYSICAL THERAPY NEURO TREATMENT   Patient Name: Richard Williams MRN: [id number] DOB:Jun 11, 1940, 82 y.o., male Today's Date: 03/28/2023   PCP: Burton Apley, MD REFERRING PROVIDER: Delfin Gant, MD  END OF SESSION:  PT End of Session - 03/28/23 1145     Visit Number 2    Number of Visits 9    Date for PT Re-Evaluation 05/23/23    Authorization Type [employer] MEDICARE    PT Start Time 1147    PT Stop Time 1229    PT Time Calculation (min) 42 min    Activity Tolerance Patient tolerated treatment well               Past Medical History:  Diagnosis Date   Anxiety    Arthritis    BPH (benign prostatic hypertrophy)    on meds   Depression    Dysrhythmia    1 episode of AF but never came back   Enlarged aorta (HCC)    GERD (gastroesophageal reflux disease)    on meds   Hyperlipidemia    Hypertension    Neuromuscular disorder (HCC)     right groin with numbness states he had back surgery in hopes to correct this, but still there   Peripheral vascular disease (HCC)    surface blood clot x1 top of left foot   Pneumonia    years ago 5,   Past Surgical History:  Procedure Laterality Date   BACK SURGERY     EYE SURGERY Bilateral    cataracts   HERNIA REPAIR     INGUINAL HERNIA REPAIR Left 11/13/2015   Procedure: OPEN REPAIR LEFT INGUINAL HERNIA;  Surgeon: Glenna Fellows, MD;  Location: [facility] OR;  Service: General;  Laterality: Left;   LAMINECTOMY WITH POSTERIOR LATERAL ARTHRODESIS LEVEL 2 N/A 03/20/2017   Procedure: Posterior Lateral Fusion - L1 - L3, segmental fixation L1-3;  Surgeon: Tia Alert, MD;  Location: [facility] OR;  Service: Neurosurgery;  Laterality: N/A;   LUMBAR LAMINECTOMY/DECOMPRESSION MICRODISCECTOMY N/A 02/10/2014   Procedure: Lumbar One to Two, Lumbar Two to Three Lumbar laminectomy for stenosis/epidural mass;  Surgeon: Tia Alert, MD;  Location: [facility] OR[facility];  Service: Neurosurgery;  Laterality: N/A;  L1-2 L2-3 Lumbar laminectomy for  stenosis/epidural mass   TONSILLECTOMY     Patient Active Problem List   Diagnosis Date Noted   DOE (dyspnea on exertion) 03/08/2022   Primary osteoarthritis of both hands 02/01/2019   S/P lumbar spinal fusion 03/20/2017   Recurrent left inguinal hernia 11/13/2015   S/P lumbar laminectomy 02/10/2014    ONSET DATE: Imbalance since surgery in 2018  REFERRING DIAG: M79.671 (ICD-10-CM) - Pain in right foot M79.672 (ICD-10-CM) - Pain in left foot R42 (ICD-10-CM) - Dizziness and giddiness  THERAPY DIAG:  Dizziness and giddiness  Unsteadiness on feet  Rationale for Evaluation and Treatment: Rehabilitation  SUBJECTIVE:  SUBJECTIVE STATEMENT: Patient arrived to physical therapy session with a walking stick. He has dizziness with rolling in bed.   Pt accompanied by: self - pt drives  PERTINENT HISTORY: Lumbar laminectomy x2 L1-L3 2015 and 2018, recurrent left inguinal hernia  PAIN:  Are you having pain? No  PRECAUTIONS: Fall  RED FLAGS: None   WEIGHT BEARING RESTRICTIONS: No  FALLS: Has patient fallen in last 6 months? No  LIVING ENVIRONMENT: Lives with: lives with their spouse Lives in: House/apartment Stairs: Yes: External: 3 steps; on right going up and garage entrance is primary entrance Has following equipment at home: Shower bench, Grab bars, and Hurrycane and trekking pole  PLOF: Independent and Uses AD (cane or trekking pole) most of the time for safety  PATIENT GOALS: none  OBJECTIVE:  Note: Objective measures were completed at Evaluation unless otherwise noted.  DIAGNOSTIC FINDINGS: No recent relevant imaging.  COGNITION: Overall cognitive status: Within functional limits for tasks assessed   SENSATION: WFL   POSTURE: forward head, weight shift right, and left  shoulder lower than right  LOWER EXTREMITY ROM:     Active  Right Eval Left Eval  Hip flexion Grossly WFL  Hip extension   Hip abduction   Hip adduction   Hip internal rotation   Hip external rotation   Knee flexion   Knee extension   Ankle dorsiflexion   Ankle plantarflexion    Ankle inversion    Ankle eversion     (Blank rows = not tested)  LOWER EXTREMITY MMT:    MMT Right Eval Left Eval  Hip flexion 4+/5 4/5  Hip extension    Hip abduction    Hip adduction    Hip internal rotation    Hip external rotation    Knee flexion    Knee extension 5/5 5/5  Ankle dorsiflexion 5/5 5/5  Ankle plantarflexion    Ankle inversion    Ankle eversion    (Blank rows = not tested)  BED MOBILITY:  Pt independent, but reports pain rolling to left side due to surgery.  TRANSFERS: Assistive device utilized:  Trekking pole   Sit to stand: Modified independence Stand to sit: Modified independence Chair to chair: Modified independence  GAIT: Gait pattern: lateral lean- Right and trunk flexed Distance walked: various clinic distances Assistive device utilized:  trekking pole Level of assistance: Modified independence Comments: No overt drift, LOB, or poor foot placement noted.  FUNCTIONAL TESTS:  5 times sit to stand: 11.75 seconds w/ BUE support Functional gait assessment: To be assessed. MCTSIB: Condition 1: Avg of 3 trials: 30 sec, Condition 2: Avg of 3 trials: 30 sec, Condition 3: Avg of 3 trials: 20.01 sec, Condition 4: Avg of 3 trials: 1.59 sec, and Total Score: 81.6/120  PATIENT SURVEYS:  ABC scale 95.63%   Vestibular Asssessment   Symptom Behavior:   Subjective history: rolling in bed L to R    Non-Vestibular symptoms: diplopia   Type of dizziness: Imbalance (Disequilibrium), Spinning/Vertigo, and Unsteady with head/body turns   Frequency: once a month    Duration: seconds    Aggravating factors: Induced by position change: rolling to the right   Relieving  factors:  keeping head still    Progression of symptoms: better   Oculomotor Exam:   Ocular Alignment: normal   Ocular ROM: No Limitations   Spontaneous Nystagmus: absent   Gaze-Induced Nystagmus: absent   Smooth Pursuits: intact   Saccades: intact   Vestibular-Ocular Reflex (VOR):   Slow VOR:  Normal   VOR Cancellation: Normal   Head-Impulse Test: HIT Right: negative HIT Left: negative   Dynamic Visual Acuity:  to be assessed as needed     Positional Testing: Right Roll Test: no nystagmus Left Roll Test: no nystagmus Right Sidelying: upbeating, right nystagmus Left Sidelying: to be assessed as needed     Motion Sensitivity: to be assessed as needed   Motion Sensitivity Quotient  Intensity: 0 = none, 1 = Lightheaded, 2 = Mild, 3 = Moderate, 4 = Severe, 5 = Vomiting Duration: < 5 s = 0, 5-10s = 1,11-30s = 2, >30s = 3 Score = Intensity + duration     Intensity Duration Score  1. Sitting to supine     2. Supine to L side     3. Supine to R side     4. Supine to sitting     5. L Hallpike-Dix     6. Up from L      7. R Hallpike-Dix     8. Up from R      9. Sitting, head  tipped to L knee     10. Head up from L  knee     11. Sitting, head  tipped to R knee     12. Head up from R  knee     13. Sitting head turns x5     14.Sitting head nods x5     15. In stance, 180  turn to L      16. In stance, 180  turn to R      MSQ = Total score  (# of positions) / 20.48 MSQ = __________________  0-10 mild; 11-30 moderate; 31-100 severe          TODAY'S TREATMENT:                  Vestibular Treatment   Canalith Repositioning:   Epley Right: Number of Reps: 1      Gans Maneuver: Number of Reps: 2                                                                                                                  PATIENT EDUCATION: Education details: PT POC, assessments used and to be used, and goals to be set.  Obtaining referral for BPPV assessment. Person  educated: Patient Education method: Explanation Education comprehension: verbalized understanding and needs further education  HOME EXERCISE PROGRAM: R side Austin Miles    GOALS: Goals reviewed with patient? Yes  SHORT TERM GOALS: Target date: 04/18/2023  Pt will be independent and compliant with balance and strength HEP in order to maintain functional progress and improve mobility. Baseline:  To be established. Goal status: INITIAL  2.  BPPV to be assessed with treatment provided as indicated. Baseline: To be assessed. 03/28/23: assessed, Right Posterior Canal  Goal status: MET  3.  Pt will decrease 5xSTS to <10 seconds w/o UE support in order to demonstrate decreased risk for falls and improved functional bilateral  LE strength and power. Baseline: 11.75 seconds w/ BUE support Goal status: INITIAL  4.  Pt will improve condition 3 of mCTSIB to >/=30 seconds in order to demonstrate improved balance strategies and static stance. Baseline: 20.01 seconds average Goal status: INITIAL  LONG TERM GOALS: Target date: 05/16/2023  Pt will improve condition 4 of mCTSIB to >/=10 seconds in order to demonstrate improved balance strategies and static stance. Baseline: 1.59 seconds average Goal status: INITIAL  2.  FGA to be assessed w/ goal set as appropriate. Baseline:  Goal status: INITIAL  3.  Right posterior canal BPPV will be clear.   Baseline: 03/28/23: nystagmus   Goal status: INITIAL   ASSESSMENT:  CLINICAL IMPRESSION: Patient was seen for physical therapy session with emphasis on vestibular assessment and positional testing. Right sidelying test was positive, indicating right posterior canal nystagmus. Did two reps of the Gans maneuver to the right and one rep of the Epley maneuver to the right. Patient was educated on findings and treatment timeline. Continue POC to reassess and treat as needed.    OBJECTIVE IMPAIRMENTS: decreased balance, decreased coordination,  decreased endurance, decreased knowledge of use of DME, difficulty walking, decreased strength, and postural dysfunction.   ACTIVITY LIMITATIONS: carrying, lifting, bending, stairs, and locomotion level  PARTICIPATION LIMITATIONS: community activity and yard work  PERSONAL FACTORS: Age, Fitness, Past/current experiences, Time since onset of injury/illness/exacerbation, and 1 comorbidity: lumbar laminectomy/fusion x2  are also affecting patient's functional outcome.   REHAB POTENTIAL: Excellent  CLINICAL DECISION MAKING: Evolving/moderate complexity  EVALUATION COMPLEXITY: Moderate  PLAN:  PT FREQUENCY: 1x/week (Pt preference)  PT DURATION: 8 weeks  PLANNED INTERVENTIONS: 97164- PT Re-evaluation, 97110-Therapeutic exercises, 97530- Therapeutic activity, 97112- Neuromuscular re-education, 97535- Self Care, 16109- Manual therapy, [phone]- Gait training, [phone]- Canalith repositioning, Patient/Family education, Balance training, Stair training, Vestibular training, and DME instructions  PLAN FOR NEXT SESSION: Assess BPPV and treat as indicated   Trendelenburg table due to decreased neck ROM   Miguel Medal M Everlena Mackley, SPT 03/28/2023, 1:06 PM

## 2023-04-01 ENCOUNTER — Encounter: Payer: Self-pay | Admitting: Physical Therapy

## 2023-04-01 ENCOUNTER — Ambulatory Visit: Payer: Medicare Other | Admitting: Physical Therapy

## 2023-04-01 VITALS — BP 138/79 | HR 95

## 2023-04-01 DIAGNOSIS — R2681 Unsteadiness on feet: Secondary | ICD-10-CM

## 2023-04-01 DIAGNOSIS — R42 Dizziness and giddiness: Secondary | ICD-10-CM

## 2023-04-01 DIAGNOSIS — M25571 Pain in right ankle and joints of right foot: Secondary | ICD-10-CM | POA: Diagnosis not present

## 2023-04-01 NOTE — Patient Instructions (Signed)
Rolling    With pillow under head, start on back. Roll slowly to right. Hold position until symptoms subside. Roll slowly onto left side. Hold position until symptoms subside. Repeat sequence __3-4__ times per session.   Only have to do rolling if you are dizzy.   Copyright  VHI. All rights reserved.

## 2023-04-01 NOTE — Therapy (Signed)
OUTPATIENT PHYSICAL THERAPY NEURO TREATMENT   Patient Name: Richard Williams MRN: [id number] DOB:May 13, 1940, 82 y.o., male Today's Date: 04/01/2023   PCP: Burton Apley, MD REFERRING PROVIDER: Delfin Gant, MD  END OF SESSION:  PT End of Session - 04/01/23 1406     Visit Number 3    Number of Visits 9    Date for PT Re-Evaluation 05/23/23    Authorization Type [employer] MEDICARE    PT Start Time 1405    PT Stop Time 1445    PT Time Calculation (min) 40 min    Activity Tolerance Patient tolerated treatment well    Behavior During Therapy WFL for tasks assessed/performed               Past Medical History:  Diagnosis Date   Anxiety    Arthritis    BPH (benign prostatic hypertrophy)    on meds   Depression    Dysrhythmia    1 episode of AF but never came back   Enlarged aorta (HCC)    GERD (gastroesophageal reflux disease)    on meds   Hyperlipidemia    Hypertension    Neuromuscular disorder (HCC)     right groin with numbness states he had back surgery in hopes to correct this, but still there   Peripheral vascular disease (HCC)    surface blood clot x1 top of left foot   Pneumonia    years ago 5,   Past Surgical History:  Procedure Laterality Date   BACK SURGERY     EYE SURGERY Bilateral    cataracts   HERNIA REPAIR     INGUINAL HERNIA REPAIR Left 11/13/2015   Procedure: OPEN REPAIR LEFT INGUINAL HERNIA;  Surgeon: Glenna Fellows, MD;  Location: [facility] OR;  Service: General;  Laterality: Left;   LAMINECTOMY WITH POSTERIOR LATERAL ARTHRODESIS LEVEL 2 N/A 03/20/2017   Procedure: Posterior Lateral Fusion - L1 - L3, segmental fixation L1-3;  Surgeon: Tia Alert, MD;  Location: [facility] OR;  Service: Neurosurgery;  Laterality: N/A;   LUMBAR LAMINECTOMY/DECOMPRESSION MICRODISCECTOMY N/A 02/10/2014   Procedure: Lumbar One to Two, Lumbar Two to Three Lumbar laminectomy for stenosis/epidural mass;  Surgeon: Tia Alert, MD;  Location: [facility] NEURO ORS;  Service:  Neurosurgery;  Laterality: N/A;  L1-2 L2-3 Lumbar laminectomy for stenosis/epidural mass   TONSILLECTOMY     Patient Active Problem List   Diagnosis Date Noted   DOE (dyspnea on exertion) 03/08/2022   Primary osteoarthritis of both hands 02/01/2019   S/P lumbar spinal fusion 03/20/2017   Recurrent left inguinal hernia 11/13/2015   S/P lumbar laminectomy 02/10/2014    ONSET DATE: Imbalance since surgery in 2018  REFERRING DIAG: M79.671 (ICD-10-CM) - Pain in right foot M79.672 (ICD-10-CM) - Pain in left foot R42 (ICD-10-CM) - Dizziness and giddiness  THERAPY DIAG:  Dizziness and giddiness  Unsteadiness on feet  Rationale for Evaluation and Treatment: Rehabilitation  SUBJECTIVE:  SUBJECTIVE STATEMENT: Notes dizziness is worse since he was last here. Notes every morning when he gets out of bed, he is pretty dizzy and has to keep his head pretty still. Dizziness happens instantly when he bends over.   Pt accompanied by: self - pt drives  PERTINENT HISTORY: Lumbar laminectomy x2 L1-L3 2015 and 2018, recurrent left inguinal hernia  PAIN:  Are you having pain? No  Vitals:   04/01/23 1414  BP: 138/79  Pulse: 95     PRECAUTIONS: Fall  RED FLAGS: None   WEIGHT BEARING RESTRICTIONS: No  FALLS: Has patient fallen in last 6 months? No  LIVING ENVIRONMENT: Lives with: lives with their spouse Lives in: House/apartment Stairs: Yes: External: 3 steps; on right going up and garage entrance is primary entrance Has following equipment at home: Shower bench, Grab bars, and Hurrycane and trekking pole  PLOF: Independent and Uses AD (cane or trekking pole) most of the time for safety  PATIENT GOALS: none  OBJECTIVE:  Note: Objective measures were completed at Evaluation unless otherwise  noted.  DIAGNOSTIC FINDINGS: No recent relevant imaging.  COGNITION: Overall cognitive status: Within functional limits for tasks assessed   SENSATION: WFL   POSTURE: forward head, weight shift right, and left shoulder lower than right  LOWER EXTREMITY ROM:     Active  Right Eval Left Eval  Hip flexion Grossly WFL  Hip extension   Hip abduction   Hip adduction   Hip internal rotation   Hip external rotation   Knee flexion   Knee extension   Ankle dorsiflexion   Ankle plantarflexion    Ankle inversion    Ankle eversion     (Blank rows = not tested)  LOWER EXTREMITY MMT:    MMT Right Eval Left Eval  Hip flexion 4+/5 4/5  Hip extension    Hip abduction    Hip adduction    Hip internal rotation    Hip external rotation    Knee flexion    Knee extension 5/5 5/5  Ankle dorsiflexion 5/5 5/5  Ankle plantarflexion    Ankle inversion    Ankle eversion    (Blank rows = not tested)  BED MOBILITY:  Pt independent, but reports pain rolling to left side due to surgery.  TRANSFERS: Assistive device utilized:  Trekking pole   Sit to stand: Modified independence Stand to sit: Modified independence Chair to chair: Modified independence  GAIT: Gait pattern: lateral lean- Right and trunk flexed Distance walked: various clinic distances Assistive device utilized:  trekking pole Level of assistance: Modified independence Comments: No overt drift, LOB, or poor foot placement noted.  FUNCTIONAL TESTS:  5 times sit to stand: 11.75 seconds w/ BUE support Functional gait assessment: To be assessed. MCTSIB: Condition 1: Avg of 3 trials: 30 sec, Condition 2: Avg of 3 trials: 30 sec, Condition 3: Avg of 3 trials: 20.01 sec, Condition 4: Avg of 3 trials: 1.59 sec, and Total Score: 81.6/120  PATIENT SURVEYS:  ABC scale 95.63%      TODAY'S TREATMENT:                 VESTIBULAR TREATMENT   POSITIONAL TESTING: Right Roll Test: geotropic nystagmus and lasting approx. 30  seconds, pt more symptomatic on this side  Left Roll Test: no nystagmus Right Sidelying: geotropic nystagmus, lasting approx. 30 seconds    When pt was supine, pt had geotropic L beating nystagmus, nystagmus went away when pt in L roll testing position  Canalith Repositioning: Appiani/Gufoni Horizontal Geotropic: Number of Reps: 1, Response to Treatment: symptoms resolved, and Comment: Treated more symptomatic R side                          With re-assessment of L and R roll test, pt with no nystagmus and dizziness.  Pt takes incr time with bed mobility and rolling.    PATIENT EDUCATION: Education details: Extensive BPPV education, purpose of maneuvers, results of vestibular assessment at previous session and today. Discussed conversion to horizontal canal from last time and how to treat and discussed resolution at end of session.  Discussed will hold off on Austin Miles at this time and if pt does get dizzy with rolling in bed, then can try repeated rolling. Added one more appt next week.  Person educated: Patient Education method: Explanation, Verbal cues, Handouts, and utilized photos on google of vestibular system/canals  Education comprehension: verbalized understanding and needs further education  HOME EXERCISE PROGRAM: R side Austin Miles (holding this exercise at this time), discussed if pt gets dizzy with rolling, can try repeated rolling    GOALS: Goals reviewed with patient? Yes  SHORT TERM GOALS: Target date: 04/18/2023  Pt will be independent and compliant with balance and strength HEP in order to maintain functional progress and improve mobility. Baseline:  To be established. Goal status: INITIAL  2.  BPPV to be assessed with treatment provided as indicated. Baseline: To be assessed. 03/28/23: assessed, Right Posterior Canal  Goal status: MET  3.  Pt will decrease 5xSTS to <10 seconds w/o UE support in order to demonstrate decreased risk for falls and improved  functional bilateral LE strength and power. Baseline: 11.75 seconds w/ BUE support Goal status: INITIAL  4.  Pt will improve condition 3 of mCTSIB to >/=30 seconds in order to demonstrate improved balance strategies and static stance. Baseline: 20.01 seconds average Goal status: INITIAL  LONG TERM GOALS: Target date: 05/16/2023  Pt will improve condition 4 of mCTSIB to >/=10 seconds in order to demonstrate improved balance strategies and static stance. Baseline: 1.59 seconds average Goal status: INITIAL  2.  FGA to be assessed w/ goal set as appropriate. Baseline:  Goal status: INITIAL  3.  Right posterior canal BPPV will be clear.   Baseline: 03/28/23: nystagmus   Goal status: INITIAL   ASSESSMENT:  CLINICAL IMPRESSION: Pt reporting feeling worse today and more dizzy than when he was last here for treatment. Re-assessed positional testing, with pt demonstrating geotropic nystagmus in R sidelying and more severe in R roll test position. Pt's had R geotropic nystagmus that lasted approx. 30 seconds in R roll position. Treated with x1 rep of Gufoni, and with re-assessment, pt with no nystagmus or dizziness indicating resolution. Due to time constraints, unable to re-check all canals at end of session and pt requesting not to re-assess until next session. Extensive BPPV education provided throughout session and answered pt's questions in regards to BPPV. Will continue per POC and treat canals as needed.     OBJECTIVE IMPAIRMENTS: decreased balance, decreased coordination, decreased endurance, decreased knowledge of use of DME, difficulty walking, decreased strength, and postural dysfunction.   ACTIVITY LIMITATIONS: carrying, lifting, bending, stairs, and locomotion level  PARTICIPATION LIMITATIONS: community activity and yard work  PERSONAL FACTORS: Age, Fitness, Past/current experiences, Time since onset of injury/illness/exacerbation, and 1 comorbidity: lumbar laminectomy/fusion x2   are also affecting patient's functional outcome.   REHAB POTENTIAL: Excellent  CLINICAL  DECISION MAKING: Evolving/moderate complexity  EVALUATION COMPLEXITY: Moderate  PLAN:  PT FREQUENCY: 1x/week (Pt preference)  PT DURATION: 8 weeks  PLANNED INTERVENTIONS: 97164- PT Re-evaluation, 97110-Therapeutic exercises, 97530- Therapeutic activity, 97112- Neuromuscular re-education, 97535- Self Care, 97140- Manual therapy, [phone]- Gait training, [phone]- Canalith repositioning, Patient/Family education, Balance training, Stair training, Vestibular training, and DME instructions  PLAN FOR NEXT SESSION: Re-check canals and treat what needs to be treated   Trendelenburg table due to decreased neck ROM   Sherlie Ban, PT, DPT 04/01/23 2:49 PM

## 2023-04-08 ENCOUNTER — Ambulatory Visit: Payer: Medicare Other | Attending: Sports Medicine | Admitting: Physical Therapy

## 2023-04-08 ENCOUNTER — Encounter: Payer: Self-pay | Admitting: Physical Therapy

## 2023-04-08 DIAGNOSIS — R42 Dizziness and giddiness: Secondary | ICD-10-CM | POA: Insufficient documentation

## 2023-04-08 DIAGNOSIS — R2681 Unsteadiness on feet: Secondary | ICD-10-CM | POA: Insufficient documentation

## 2023-04-08 NOTE — Therapy (Signed)
OUTPATIENT PHYSICAL THERAPY NEURO TREATMENT/DISCHARGE SUMMARY   Patient Name: Richard Williams MRN: [id number] DOB:10/11/1940, 82 y.o., male Today's Date: 04/08/2023   PCP: Burton Apley, MD REFERRING PROVIDER: Delfin Gant, MD  END OF SESSION:  PT End of Session - 04/08/23 1536     Visit Number 4    Number of Visits 9    Date for PT Re-Evaluation 05/23/23    Authorization Type [employer] MEDICARE    PT Start Time 1533    PT Stop Time 1550   full time not used due to D/C visit   PT Time Calculation (min) 17 min    Activity Tolerance Patient tolerated treatment well    Behavior During Therapy WFL for tasks assessed/performed               Past Medical History:  Diagnosis Date   Anxiety    Arthritis    BPH (benign prostatic hypertrophy)    on meds   Depression    Dysrhythmia    1 episode of AF but never came back   Enlarged aorta (HCC)    GERD (gastroesophageal reflux disease)    on meds   Hyperlipidemia    Hypertension    Neuromuscular disorder (HCC)     right groin with numbness states he had back surgery in hopes to correct this, but still there   Peripheral vascular disease (HCC)    surface blood clot x1 top of left foot   Pneumonia    years ago 5,   Past Surgical History:  Procedure Laterality Date   BACK SURGERY     EYE SURGERY Bilateral    cataracts   HERNIA REPAIR     INGUINAL HERNIA REPAIR Left 11/13/2015   Procedure: OPEN REPAIR LEFT INGUINAL HERNIA;  Surgeon: Glenna Fellows, MD;  Location: [facility] OR;  Service: General;  Laterality: Left;   LAMINECTOMY WITH POSTERIOR LATERAL ARTHRODESIS LEVEL 2 N/A 03/20/2017   Procedure: Posterior Lateral Fusion - L1 - L3, segmental fixation L1-3;  Surgeon: Tia Alert, MD;  Location: [facility] OR;  Service: Neurosurgery;  Laterality: N/A;   LUMBAR LAMINECTOMY/DECOMPRESSION MICRODISCECTOMY N/A 02/10/2014   Procedure: Lumbar One to Two, Lumbar Two to Three Lumbar laminectomy for stenosis/epidural mass;  Surgeon: Tia Alert, MD;  Location: [facility] NEUR[facility] ORS;  Service: Neurosurgery;  Laterality: N/A;  L1-2 L2-3 Lumbar laminectomy for stenosis/epidural mass   TONSILLECTOMY     Patient Active Problem List   Diagnosis Date Noted   DOE (dyspnea on exertion) 03/08/2022   Primary osteoarthritis of both hands 02/01/2019   S/P lumbar spinal fusion 03/20/2017   Recurrent left inguinal hernia 11/13/2015   S/P lumbar laminectomy 02/10/2014    ONSET DATE: Imbalance since surgery in 2018  REFERRING DIAG: M79.671 (ICD-10-CM) - Pain in right foot M79.672 (ICD-10-CM) - Pain in left foot R42 (ICD-10-CM) - Dizziness and giddiness  THERAPY DIAG:  Dizziness and giddiness  Unsteadiness on feet  Rationale for Evaluation and Treatment: Rehabilitation  SUBJECTIVE:  SUBJECTIVE STATEMENT: Has not had any dizziness since he was last here.   Pt accompanied by: self - pt drives  PERTINENT HISTORY: Lumbar laminectomy x2 L1-L3 2015 and 2018, recurrent left inguinal hernia  PAIN:  Are you having pain? No  There were no vitals filed for this visit.    PRECAUTIONS: Fall  RED FLAGS: None   WEIGHT BEARING RESTRICTIONS: No  FALLS: Has patient fallen in last 6 months? No  LIVING ENVIRONMENT: Lives with: lives with their spouse Lives in: House/apartment Stairs: Yes: External: 3 steps; on right going up and garage entrance is primary entrance Has following equipment at home: Shower bench, Grab bars, and Hurrycane and trekking pole  PLOF: Independent and Uses AD (cane or trekking pole) most of the time for safety  PATIENT GOALS: none  OBJECTIVE:  Note: Objective measures were completed at Evaluation unless otherwise noted.  DIAGNOSTIC FINDINGS: No recent relevant imaging.  COGNITION: Overall cognitive status: Within functional  limits for tasks assessed   SENSATION: WFL   POSTURE: forward head, weight shift right, and left shoulder lower than right  LOWER EXTREMITY ROM:     Active  Right Eval Left Eval  Hip flexion Grossly WFL  Hip extension   Hip abduction   Hip adduction   Hip internal rotation   Hip external rotation   Knee flexion   Knee extension   Ankle dorsiflexion   Ankle plantarflexion    Ankle inversion    Ankle eversion     (Blank rows = not tested)  LOWER EXTREMITY MMT:    MMT Right Eval Left Eval  Hip flexion 4+/5 4/5  Hip extension    Hip abduction    Hip adduction    Hip internal rotation    Hip external rotation    Knee flexion    Knee extension 5/5 5/5  Ankle dorsiflexion 5/5 5/5  Ankle plantarflexion    Ankle inversion    Ankle eversion    (Blank rows = not tested)  BED MOBILITY:  Pt independent, but reports pain rolling to left side due to surgery.  TRANSFERS: Assistive device utilized:  Trekking pole   Sit to stand: Modified independence Stand to sit: Modified independence Chair to chair: Modified independence  GAIT: Gait pattern: lateral lean- Right and trunk flexed Distance walked: various clinic distances Assistive device utilized:  trekking pole Level of assistance: Modified independence Comments: No overt drift, LOB, or poor foot placement noted.  FUNCTIONAL TESTS:  5 times sit to stand: 11.75 seconds w/ BUE support Functional gait assessment: To be assessed. MCTSIB: Condition 1: Avg of 3 trials: 30 sec, Condition 2: Avg of 3 trials: 30 sec, Condition 3: Avg of 3 trials: 20.01 sec, Condition 4: Avg of 3 trials: 1.59 sec, and Total Score: 81.6/120  PATIENT SURVEYS:  ABC scale 95.63%      TODAY'S TREATMENT:                 VESTIBULAR TREATMENT   POSITIONAL TESTING: Right Roll Test: no nystagmus Left Roll Test: no nystagmus Right Sidelying: no nystagmus Left Sidelying: no nystagmus  No nystagmus/dizziness in any position.     PATIENT  EDUCATION: Education details: BPPV education, discussed resolution of BPPV at this time and educated on reoccurrence rates. Discussed pt does not have to do [facility] or rolling exercises at home due to BPPV being resolved at this time. Educated that if BPPV does return in the future, then pt will need to get a new referral  from his physician to return.  Person educated: Patient Education method: Explanation and Verbal cues Education comprehension: verbalized understanding and needs further education  HOME EXERCISE PROGRAM: N/A - BPPV cleared  PHYSICAL THERAPY DISCHARGE SUMMARY  Visits from Start of Care: 4  Current functional level related to goals / functional outcomes: See LTGs/Clinical Assessment Statement   Remaining deficits: Impaired balance (pt's baseline) and uses trekking pole for safety, decr cervical AROM   Education / Equipment: HEP, BPPV education   Patient agrees to discharge. Patient goals were met. Patient is being discharged due to meeting the stated rehab goals. And pt with resolution of BPPV/pt no longer having dizziness.   GOALS: Goals reviewed with patient? Yes  SHORT TERM GOALS: Target date: 04/18/2023  Pt will be independent and compliant with balance and strength HEP in order to maintain functional progress and improve mobility. Baseline:  To be established. Goal status: INITIAL  2.  BPPV to be assessed with treatment provided as indicated. Baseline: To be assessed. 03/28/23: assessed, Right Posterior Canal  Goal status: MET  3.  Pt will decrease 5xSTS to <10 seconds w/o UE support in order to demonstrate decreased risk for falls and improved functional bilateral LE strength and power. Baseline: 11.75 seconds w/ BUE support Goal status: INITIAL  4.  Pt will improve condition 3 of mCTSIB to >/=30 seconds in order to demonstrate improved balance strategies and static stance. Baseline: 20.01 seconds average Goal status: INITIAL  LONG TERM GOALS:  Target date: 05/16/2023  Pt will improve condition 4 of mCTSIB to >/=10 seconds in order to demonstrate improved balance strategies and static stance. Baseline: 1.59 seconds average Goal status: INITIAL  2.  FGA to be assessed w/ goal set as appropriate. Baseline:  Goal status: N/A  3.  Right posterior canal BPPV will be clear.   Baseline: cleared on 12/3  Goal status: MET  ASSESSMENT:  CLINICAL IMPRESSION: Pt reporting no dizziness since he was last here. Re-assessed all canals and pt cleared with no nystagmus or dizziness. Due to resolution of BPPV, pt would like to be discharged at this time. No further assessment of balance needed. Educated that since canals are cleared, then pt does not need to do [employer] or repeated rolling at home. Discussed if BPPV returns in future, then pt will need a new referral to return. Pt verbalized understanding and is in agreement to D/C.     OBJECTIVE IMPAIRMENTS: decreased balance, decreased coordination, decreased endurance, decreased knowledge of use of DME, difficulty walking, decreased strength, and postural dysfunction.   ACTIVITY LIMITATIONS: carrying, lifting, bending, stairs, and locomotion level  PARTICIPATION LIMITATIONS: community activity and yard work  PERSONAL FACTORS: Age, Fitness, Past/current experiences, Time since onset of injury/illness/exacerbation, and 1 comorbidity: lumbar laminectomy/fusion x2  are also affecting patient's functional outcome.   REHAB POTENTIAL: Excellent  CLINICAL DECISION MAKING: Evolving/moderate complexity  EVALUATION COMPLEXITY: Moderate  PLAN:  PT FREQUENCY: 1x/week (Pt preference)  PT DURATION: 8 weeks  PLANNED INTERVENTIONS: 97164- PT Re-evaluation, 97110-Therapeutic exercises, 97530- Therapeutic activity, [id number]- Neuromuscular re-education, 97535- Self Care, [id number]- Manual therapy, [id number]- Gait training, [phone]- Canalith repositioning, Patient/Family education, Balance training, Stair  training, Vestibular training, and DME instructions  PLAN FOR NEXT SESSION: D/C from PT  Sherlie Ban, PT, DPT 04/08/23 3:57 PM

## 2023-06-16 ENCOUNTER — Telehealth: Payer: Self-pay | Admitting: Rheumatology

## 2023-06-16 NOTE — Telephone Encounter (Signed)
 He can order nerve conduction velocity and EMG test.  If patient does not want to have test done then we can schedule an ultrasound guided carpal tunnel injection.  If patient wants injection we can schedule an appointment.

## 2023-06-16 NOTE — Telephone Encounter (Signed)
 Patient called stating he has been experiencing bilateral hand pain.  Patient states he wears the carpal tunnel gloves for support which do help, but still experiences tingling and doesn't want to rush into surgery.  Patient requested a return call to discuss other options.

## 2023-06-17 NOTE — Telephone Encounter (Signed)
I called patient, patient wants US guided injections bil scheduled 06/18/2023.

## 2023-06-18 ENCOUNTER — Ambulatory Visit: Payer: Medicare Other | Attending: Rheumatology | Admitting: Rheumatology

## 2023-06-18 ENCOUNTER — Ambulatory Visit (INDEPENDENT_AMBULATORY_CARE_PROVIDER_SITE_OTHER): Payer: Medicare Other

## 2023-06-18 ENCOUNTER — Ambulatory Visit: Payer: Medicare Other

## 2023-06-18 DIAGNOSIS — G5601 Carpal tunnel syndrome, right upper limb: Secondary | ICD-10-CM | POA: Diagnosis not present

## 2023-06-18 DIAGNOSIS — G5602 Carpal tunnel syndrome, left upper limb: Secondary | ICD-10-CM | POA: Diagnosis not present

## 2023-06-18 MED ORDER — TRIAMCINOLONE ACETONIDE 40 MG/ML IJ SUSP
20.0000 mg | INTRAMUSCULAR | Status: AC | PRN
  Administered 2023-06-18: 20 mg

## 2023-06-18 MED ORDER — LIDOCAINE HCL 1 % IJ SOLN
0.5000 mL | INTRAMUSCULAR | Status: AC | PRN
  Administered 2023-06-18: .5 mL

## 2023-06-18 NOTE — Progress Notes (Signed)
   Procedure Note  Patient: Richard Williams             Date of Birth: October 27, 1940           MRN: [id number]             Visit Date: 06/18/2023  Procedures: Visit Diagnoses:  1. Carpal tunnel syndrome, right upper limb   2. Carpal tunnel syndrome, left upper limb   Patient called as he continued to have pain and discomfort in his bilateral hands due to carpal tunnel syndrome.  He requested carpal tunnel injections.  Informed consent was obtained and side effects were discussed at length.  Bilateral carpal tunnel injections were performed as described below.  Patient tolerated the procedure well.  Postprocedure instructions were given.  Ultrasound guided injection is preferred based studies that show increased duration, increased effect, greater accuracy, decreased procedural pain, increased response rate, and decreased cost with ultrasound guided versus blind injection.   Verbal informed consent obtained.  Time-out conducted.  Noted no overlying erythema, induration, or other signs of local infection. Ultrasound-guided carpal tunnel injection: After sterile prep with Betadine, injected 0.5 mL of 1% lidocaine and 20 mg Kenalog using a 27-gauge needle, in the median nerve sheath.    Hand/UE Inj: bilateral carpal tunnel for carpal tunnel syndrome on 06/18/2023 8:20 AM Indications: pain Details: 27 G needle, ultrasound-guided ulnar approach Medications (Right): 0.5 mL lidocaine 1 %; 20 mg triamcinolone acetonide 40 MG/ML Medications (Left): 0.5 mL lidocaine 1 %; 20 mg triamcinolone acetonide 40 MG/ML Outcome: tolerated well, no immediate complications Procedure, treatment alternatives, risks and benefits explained, specific risks discussed. Consent was given by the patient. Immediately prior to procedure a time out was called to verify the correct patient, procedure, equipment, support staff and site/side marked as required. Patient was prepped and draped in the usual sterile fashion.    Patient was  advised to use carpal tunnel braces. Pollyann Savoy, MD

## 2023-08-19 ENCOUNTER — Ambulatory Visit: Payer: Medicare Other | Admitting: Rheumatology

## 2023-09-08 ENCOUNTER — Ambulatory Visit: Admitting: Rheumatology

## 2023-10-09 ENCOUNTER — Other Ambulatory Visit: Payer: Self-pay | Admitting: Internal Medicine

## 2023-10-09 ENCOUNTER — Ambulatory Visit
Admission: RE | Admit: 2023-10-09 | Discharge: 2023-10-09 | Disposition: A | Discharge status: Home / Self Care | Source: Ambulatory Visit | Attending: Internal Medicine | Admitting: Internal Medicine

## 2023-10-09 DIAGNOSIS — R059 Cough, unspecified: Secondary | ICD-10-CM

## 2023-11-05 NOTE — Progress Notes (Signed)
 Office Visit Note  Patient: Richard Williams             Date of Birth: [birth year]           MRN: [id number]             PCP: Henry Ingle, MD Referring: Henry Ingle, MD Visit Date: 11/19/2023 Occupation: @GUAROCC @  Subjective:  Itching all over, joint pain   History of Present Illness: Richard Williams is a 83 y.o. male with osteoarthritis and degenerative disc disease returns today after the last visit in October 2024.  He states he had very good response to bilateral carpal tunnel injections in February 2025.  He is not having any paresthesias in his hands.  He also has not had recurrence of trigger thumb.  He notices some discomfort in the [facility] joints after gripping objects.  He uses carpal tunnel braces as needed.  And also carpal tunnel braces needed.  He has been experiencing some weakness in his left lower extremity only first thing in the morning which gets better during the day.  He has not noticed any increased joint pain or joint swelling.  He denies any lower back pain.  He has been using a walking stick for stability.  He states for the last 6 months he has been having pruritus which he describes on his scalp and all over the body.  He has seen dermatologist and had some topical agents which were not very effective.  He would like to be evaluated by an allergist.  He has been using moisturizing lotion which has helped only to some extent on his body.    Activities of Daily Living:  Patient reports morning stiffness for 30 minutes.   Patient Denies nocturnal pain.  Difficulty dressing/grooming: Denies Difficulty climbing stairs: Denies Difficulty getting out of chair: Denies Difficulty using hands for taps, buttons, cutlery, and/or writing: Reports  Review of Systems  Constitutional:  Negative for fatigue.  HENT:  Negative for mouth sores and mouth dryness.   Eyes:  Negative for dryness.  Respiratory:  Positive for shortness of breath.   Cardiovascular:  Negative for  chest pain and palpitations.  Gastrointestinal:  Positive for constipation. Negative for blood in stool and diarrhea.  Endocrine: Negative for increased urination.  Genitourinary:  Negative for involuntary urination.  Musculoskeletal:  Positive for gait problem and morning stiffness. Negative for joint pain, joint pain, joint swelling, myalgias, muscle weakness, muscle tenderness and myalgias.  Skin:  Positive for sensitivity to sunlight. Negative for color change, rash and hair loss.  Allergic/Immunologic: Negative for susceptible to infections.  Neurological:  Positive for dizziness. Negative for headaches.  Hematological:  Negative for swollen glands.  Psychiatric/Behavioral:  Positive for sleep disturbance. Negative for depressed mood. The patient is nervous/anxious.     PMFS History:  Patient Active Problem List   Diagnosis Date Noted   DOE (dyspnea on exertion) 03/08/2022   Primary osteoarthritis of both hands 02/01/2019   S/P lumbar spinal fusion 03/20/2017   Recurrent left inguinal hernia 11/13/2015   S/P lumbar laminectomy 02/10/2014    Past Medical History:  Diagnosis Date   Anxiety    Arthritis    BPH (benign prostatic hypertrophy)    on meds   Depression    Dysrhythmia    1 episode of AF but never came back   Enlarged aorta (HCC)    GERD (gastroesophageal reflux disease)    on meds   Hyperlipidemia    Hypertension  Neuromuscular disorder (HCC)     right groin with numbness states he had back surgery in hopes to correct this, but still there   Peripheral vascular disease (HCC)    surface blood clot x1 top of left foot   Pneumonia    years ago 5,    Family History  Problem Relation Age of Onset   Stroke Mother    Heart Problems Father    Macular degeneration Sister    Healthy Daughter    Past Surgical History:  Procedure Laterality Date   BACK SURGERY     EYE SURGERY Bilateral    cataracts   HERNIA REPAIR     INGUINAL HERNIA REPAIR Left 11/13/2015    Procedure: OPEN REPAIR LEFT INGUINAL HERNIA;  Surgeon: Morene Olives, MD;  Location: [facility] OR;  Service: General;  Laterality: Left;   LAMINECTOMY WITH POSTERIOR LATERAL ARTHRODESIS LEVEL 2 N/A 03/20/2017   Procedure: Posterior Lateral Fusion - L1 - L3, segmental fixation L1-3;  Surgeon: Joshua Alm RAMAN, MD;  Location: [facility] OR;  Service: Neurosurgery;  Laterality: N/A;   LUMBAR LAMINECTOMY/DECOMPRESSION MICRODISCECTOMY N/A 02/10/2014   Procedure: Lumbar One to Two, Lumbar Two to Three Lumbar laminectomy for stenosis/epidural mass;  Surgeon: Alm RAMAN Joshua, MD;  Location: [facility] NEURO ORS;  Service: Neurosurgery;  Laterality: N/A;  L1-2 L2-3 Lumbar laminectomy for stenosis/epidural mass   TONSILLECTOMY     Social History   Social History Narrative   Not on file   Immunization History  Administered Date(s) Administered   Influenza, High Dose Seasonal PF 02/05/2019   Moderna Covid-19 Vaccine Bivalent Booster 80yrs & up 02/15/2021   Moderna Sars-Covid-2 Vaccination 05/18/2019, 06/28/2019, 02/28/2020, 11/10/2020   Zoster Recombinant(Shingrix) 07/16/2017     Objective: Vital Signs: BP 103/64 (BP Location: Left Arm, Patient Position: Sitting, Cuff Size: Normal)   Pulse 87   Resp 17   Ht 5' 11 (1.803 m)   Wt 213 lb (96.6 kg)   BMI 29.71 kg/m    Physical Exam Vitals and nursing note reviewed.  Constitutional:      Appearance: He is well-developed.  HENT:     Head: Normocephalic and atraumatic.  Eyes:     Conjunctiva/sclera: Conjunctivae normal.     Pupils: Pupils are equal, round, and reactive to light.  Cardiovascular:     Rate and Rhythm: Normal rate and regular rhythm.     Heart sounds: Normal heart sounds.  Pulmonary:     Effort: Pulmonary effort is normal.     Breath sounds: Normal breath sounds.  Abdominal:     General: Bowel sounds are normal.     Palpations: Abdomen is soft.  Musculoskeletal:     Cervical back: Normal range of motion and neck supple.  Skin:    General: Skin  is warm and dry.     Capillary Refill: Capillary refill takes less than 2 seconds.  Neurological:     Mental Status: He is alert and oriented to person, place, and time.  Psychiatric:        Behavior: Behavior normal.      Musculoskeletal Exam: He had limited range of motion of the cervical spine without discomfort.  Thoracic kyphosis was noted.  He had no tenderness over thoracic or lumbar spine.  Shoulders, elbows, wrist joints were in good range of motion.  He had good pincer grasp bilaterally.  Bilateral PIP and DIP thickening with no synovitis was noted.  Hip joints and knee joints in good range of motion without any warmth  swelling or effusion.  There was no tenderness over ankles or MTPs.  CDAI Exam: CDAI Score: -- Patient Global: --; Provider Global: -- Swollen: --; Tender: -- Joint Exam 11/19/2023   No joint exam has been documented for this visit   There is currently no information documented on the homunculus. Go to the Rheumatology activity and complete the homunculus joint exam.  Investigation: No additional findings.  Imaging: No results found.   Recent Labs: Lab Results  Component Value Date   WBC 9.0 03/11/2017   HGB 14.0 03/11/2017   PLT 183 03/11/2017   NA 138 03/11/2017   K 4.0 03/11/2017   CL 106 03/11/2017   CO2 24 03/11/2017   GLUCOSE 125 (H) 03/11/2017   BUN 24 (H) 03/11/2017   CREATININE 1.29 (H) 03/11/2017   CALCIUM 9.2 03/11/2017   GFRAA >60 03/11/2017    Speciality Comments: No specialty comments available.  Procedures:  No procedures performed Allergies: Codeine, Other, Azithromycin, Lorazepam, Penicillins, Amoxicillin, and Hydrocodone   Assessment / Plan:     Visit Diagnoses: Primary osteoarthritis of both hands-he continues to have some stiffness in his bilateral hands with PIP and DIP thickening.  He continues to have some discomfort over the [facility] joints.  He uses [facility] brace as needed.  Bilateral carpal tunnel syndrome - Patient  declined NCV and EMG.  A prescription for carpal tunnel brace was given at the last visit.he has been using braces on as needed basis.  Bilateral carpal tunnel injections were performed on June 18, 2023.  He had good response to injections.  He states he is currently asymptomatic.    Trigger thumb, left thumb - Injected December 01, 2018 with no recurrence.  Chronic pain of right knee - Increased pain with biking.  No warmth swelling or effusion was noted.  Primary osteoarthritis of both feet - Metatarsalgia.  Patient was referred to orthopedics.  S/P lumbar spinal fusion - Status post fusion x 2.  He ambulates with the help of a cane.  History of intermittent discomfort.  He complains of some weakness in his left lower extremity only in the morning and it gets better after few minutes.  Gait instability-he gives history of gait instability.  He has been using a walking stick.  I offered referral to physical therapy which he declined.  A handout on lower extremity exercises was given.  Pruritus-he has been experiencing pruritus on his scalp and all over the body.  He seen dermatologist without much relief.  He wants to be  referred to an allergist.  History of hyperlipidemia-followed by cardiology.  Essential hypertension-blood pressure was normal today.  Anxiety and depression  Orders: Orders Placed This Encounter  Procedures   Ambulatory referral to Allergy   No orders of the defined types were placed in this encounter.   Face-to-face time spent with patient was 30 minutes. Greater than 50% of time was spent in counseling and coordination of care.  Follow-Up Instructions: Return in about 8 months (around 07/19/2024) for Osteoarthritis.   Maya Nash, MD  Note - This record has been created using Animal nutritionist.  Chart creation errors have been sought, but may not always  have been located. Such creation errors do not reflect on  the standard of medical care.

## 2023-11-19 ENCOUNTER — Encounter: Payer: Self-pay | Admitting: Rheumatology

## 2023-11-19 ENCOUNTER — Ambulatory Visit: Attending: Rheumatology | Admitting: Rheumatology

## 2023-11-19 VITALS — BP 103/64 | HR 87 | Resp 17 | Ht 71.0 in | Wt 213.0 lb

## 2023-11-19 DIAGNOSIS — F419 Anxiety disorder, unspecified: Secondary | ICD-10-CM

## 2023-11-19 DIAGNOSIS — M19042 Primary osteoarthritis, left hand: Secondary | ICD-10-CM

## 2023-11-19 DIAGNOSIS — M65312 Trigger thumb, left thumb: Secondary | ICD-10-CM

## 2023-11-19 DIAGNOSIS — G5603 Carpal tunnel syndrome, bilateral upper limbs: Secondary | ICD-10-CM

## 2023-11-19 DIAGNOSIS — R2681 Unsteadiness on feet: Secondary | ICD-10-CM

## 2023-11-19 DIAGNOSIS — M19071 Primary osteoarthritis, right ankle and foot: Secondary | ICD-10-CM

## 2023-11-19 DIAGNOSIS — M25561 Pain in right knee: Secondary | ICD-10-CM | POA: Diagnosis not present

## 2023-11-19 DIAGNOSIS — F32A Depression, unspecified: Secondary | ICD-10-CM

## 2023-11-19 DIAGNOSIS — G8929 Other chronic pain: Secondary | ICD-10-CM

## 2023-11-19 DIAGNOSIS — Z981 Arthrodesis status: Secondary | ICD-10-CM

## 2023-11-19 DIAGNOSIS — L299 Pruritus, unspecified: Secondary | ICD-10-CM

## 2023-11-19 DIAGNOSIS — M19041 Primary osteoarthritis, right hand: Secondary | ICD-10-CM | POA: Diagnosis not present

## 2023-11-19 DIAGNOSIS — I10 Essential (primary) hypertension: Secondary | ICD-10-CM

## 2023-11-19 DIAGNOSIS — Z8639 Personal history of other endocrine, nutritional and metabolic disease: Secondary | ICD-10-CM

## 2023-11-19 DIAGNOSIS — M19072 Primary osteoarthritis, left ankle and foot: Secondary | ICD-10-CM

## 2023-11-19 NOTE — Patient Instructions (Signed)
 Exercises for Chronic Knee Pain  Chronic knee pain is pain that lasts longer than 3 months. For most people with chronic knee pain, exercise and weight loss is an important part of treatment. Your health care provider may want you to focus on:  Making the muscles that support your knee stronger. This can take pressure off your knee and reduce pain.  Preventing knee stiffness.  How far you can move your knee, keeping it there or making it farther.  Losing weight (if this applies) to take pressure off your knee, lower your risk for injury, and make it easier for you to exercise.  Your provider will help you make an exercise program that fits your needs and physical abilities. Below are simple, low-impact exercises you can do at home. Ask your provider or physical therapist how often you should do your exercise program and how many times to repeat each exercise.  General safety tips    Get your provider's approval before doing any exercises.  Start slowly and stop any time you feel pain.  Do not exercise if your knee pain is flaring up.  Warm up first. Stretching a cold muscle can cause an injury. Do 5-10 minutes of easy movement or light stretching before beginning your exercises.  Do 5-10 minutes of low-impact activity (like walking or cycling) before starting strengthening exercises.  Contact your provider any time you have pain during or after exercising. Exercise can cause discomfort but should not be painful. It is normal to be a little stiff or sore after exercising.  Stretching and range-of-motion exercises  Front thigh stretch    Stand up straight and support your body by holding on to a chair or resting one hand on a M[place]rea.  With your legs straight and close together, bend one knee to lift your heel up toward your butt.  Using one hand for support, grab your ankle with your free hand.  Pull your foot up closer toward your butt to feel the stretch in front of your thigh.  Hold the stretch for 30  seconds.  Repeat __________ times. Complete this exercise __________ times a day.  Back thigh stretch    Sit on the floor with your back straight and your legs out straight in front of you.  Place the palms of your hands on the floor and slide them toward your feet as you bend at the hip.  Try to touch your nose to your knees and feel the stretch in the back of your thighs.  Hold for 30 seconds.  Repeat __________ times. Complete this exercise __________ times a day.  Calf stretch    Stand facing a Sheehy.  Place the palms of your hands flat against the Ghazi, arms extended, and lean slightly against the Muha.  Get into a lunge position with one leg bent at the knee and the other leg stretched out straight behind you.  Keep both feet facing the Ziesmer and increase the bend in your knee while keeping the heel of the other leg flat on the ground.  You should feel the stretch in your calf. Hold for 30 seconds.  Repeat __________ times. Complete this exercise __________ times a day.  Strengthening exercises  Straight leg lift    Lie on your back with one knee bent and the other leg out straight.  Slowly lift the straight leg without bending the knee.  Lift until your foot is about 12 inches (30 cm) off the floor.  Hold for  3-5 seconds and slowly lower your leg.  Repeat __________ times. Complete this exercise __________ times a day.  Single leg dip    Stand between two chairs and put both hands on the backs of the chairs for support.  Extend one leg out straight with your body weight resting on the heel of the standing leg.  Slowly bend your standing knee to dip your body to the level that is comfortable for you.  Hold for 3-5 seconds.  Repeat __________ times. Complete this exercise __________ times a day.  Hamstring curls    Stand straight, knees close together, facing the back of a chair.  Hold on to the back of a chair with both hands.  Keep one leg straight. Bend the other knee while bringing the heel up toward the butt  until the knee is bent at a 90-degree angle (right angle).  Hold for 3-5 seconds.  Repeat __________ times. Complete this exercise __________ times a day.  Neece squat    Stand straight with your back, hips, and head against a Rushing.  Step forward one foot at a time with your back still against the Spargur.  Your feet should be 2 feet (61 cm) from the Jeanpaul at shoulder width. Keeping your back, hips, and head against the Colligan, slide down the Brenner to as close to a sitting position as you can get.  Hold for 5-10 seconds, then slowly slide back up.  Repeat __________ times. Complete this exercise __________ times a day.  Step-ups    Stand in front of a sturdy platform or stool that is about 6 inches (15 cm) high.  Slowly step up with your left / right foot, keeping your knee in line with your hip and foot. Do not let your knee bend so far that you cannot see your toes. Hold on to a chair for balance, but do not use it for support.  Slowly unlock your knee and lower yourself to the starting position.  Repeat __________ times. Complete this exercise __________ times a day.  Contact a health care provider if:  Your exercises cause pain.  Your pain is worse after you exercise.  Your pain prevents you from doing your exercises.  This information is not intended to replace advice given to you by your health care provider. Make sure you discuss any questions you have with your health care provider.  Document Revised: 05/07/2022 Document Reviewed: 05/07/2022  Elsevier Patient Education  2024 [employer].

## 2023-12-17 NOTE — Progress Notes (Signed)
 New Patient Note  RE: Richard Williams MRN: [id number] DOB: 1940/10/13 Date of Office Visit: 12/18/2023  Consult requested by: Dolphus Reiter, MD Primary care provider: Henry Ingle, MD  Chief Complaint: Pruritus (Itchy head- itching comes and goes ), Dermatitis (Gold), and Eczema (Invisible?)  History of Present Illness: I had the pleasure of seeing Richard Williams for initial evaluation at the [facility] on 12/18/2023. He is a 83 y.o. male, who is referred here by Dolphus Reiter, MD (rheumatology) for the evaluation of pruritus.  Discussed the use of AI scribe software for clinical note transcription with the patient, who gave verbal consent to proceed.    He has been experiencing an intensely itchy scalp for a couple of months, with symptoms occurring daily and throughout the day. There is no accompanying rash. He has previously consulted a dermatologist who did not observe any visible rash and prescribed clobetasol, which provides temporary relief for about a day. He also uses a gentle shampoo and conditioner called Seen, and finds that wetting his hair with cold water temporarily alleviates the itch.  He denies any recent changes in personal care products such as laundry detergent or body wash. His wife, who has eczema, also experiences itching but with visible symptoms. He has a history of hives triggered by certain medications like those in the penicillin family and hydrocodone, but no spontaneous hives without a known trigger.  He has been using Allegra D for nasal congestion, which he finds effective, and also uses Flonase nasal spray. No history of asthma or inhaler use. He has undergone allergy testing in the past due to nonallergic rhinitis.  He mentions experiencing hair loss and is concerned about the potential for temporary hair loss.      Head pruritus started about a few months ago.  Frequency of episodes: daily. Suspected triggers are unknown.  Denies any fevers, chills, changes in medications, foods, personal care products or recent infections. He has tried the following therapies: clobetasol with some benefit.   Previous work up includes: saw dermatology in the past. Previous history of rash/hives: sometimes gets hives from penicillin and other medications. Patient is up to date with the following cancer screening tests: physical exam, colonoscopy.  Currently using conditioner and shampoo mix for gentle hair.  11/19/2023 rheumatology visit: He states for the last 6 months he has been having pruritus which he describes on his scalp and all over the body. He has seen dermatologist and had some topical agents which were not very effective. He would like to be evaluated by an allergist. He has been using moisturizing lotion which has helped only to some extent on his body.  Assessment / Plan:     Visit Diagnoses: Primary osteoarthritis of both hands-he continues to have some stiffness in his bilateral hands with PIP and DIP thickening.  He continues to have some discomfort over the [facility] joints.  He uses [facility] brace as needed.   Bilateral carpal tunnel syndrome - Patient declined NCV and EMG.  A prescription for carpal tunnel brace was given at the last visit.he has been using braces on as needed basis.  Bilateral carpal tunnel injections were performed on June 18, 2023.  He had good response to injections.  He states he is currently asymptomatic.     Trigger thumb, left thumb - Injected December 01, 2018 with no recurrence.   Chronic pain of right knee - Increased pain with biking.  No warmth swelling or effusion was  noted.   Primary osteoarthritis of both feet - Metatarsalgia.  Patient was referred to orthopedics.   S/P lumbar spinal fusion - Status post fusion x 2.  He ambulates with the help of a cane.  History of intermittent discomfort.  He complains of some weakness in his left lower extremity only in the morning and it gets better  after few minutes.   Gait instability-he gives history of gait instability.  He has been using a walking stick.  I offered referral to physical therapy which he declined.  A handout on lower extremity exercises was given.   Pruritus-he has been experiencing pruritus on his scalp and all over the body.  He seen dermatologist without much relief.  He wants to be  referred to an allergist.   History of hyperlipidemia-followed by cardiology.   Essential hypertension-blood pressure was normal today.   Anxiety and depression  Assessment and Plan: Richard Williams is a 83 y.o. male with: Scalp pruritus Chronic scalp pruritus for less than six months, no visible rash or lesions. Previous dermatological evaluation unremarkable. Cold water provides temporary relief. Unclear etiology.  Based on location - concern for possible contact irritation vs scalp nerve irritation.  See below for proper skin care. Use fragrance free and dye free products. No dryer sheets or fabric softener.   Only use shampoo on the scalp and double rinse. No conditioner and no gel.  Limit hat use.  May take hydroxyzine  10mg  1 hour before bedtime for itching. This may make you tired.  If the above regimen does not work, then will try gabapentin  next.   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 Skin testing in the past was negative to environmental panel per patient.  Use Flonase (fluticasone) nasal spray 1-2 sprays per nostril once a day as needed for nasal congestion.  Nasal saline spray (i.e., Simply Saline) or nasal saline lavage (i.e., NeilMed) is recommended as needed and prior to medicated nasal sprays. Try to wean off the decongestant pills - it is not recommended to take decongestant medications on a long term basis.   Return in about 6 weeks (around 01/29/2024).  Meds ordered this encounter  Medications   hydrOXYzine  (ATARAX ) 10 MG tablet    Sig: Take 1 tablet (10 mg total) by mouth at bedtime as  needed for itching.    Dispense:  30 tablet    Refill:  2   Lab Orders  No laboratory test(s) ordered today    Other allergy screening: Asthma: no Rhino conjunctivitis:  Patient had skin testing which was negative. Diagnosed with non-allergic rhinitis. Takes allegra-D   Food allergy: no Medication allergy: yes Hymenoptera allergy: no History of recurrent infections suggestive of immunodeficency: no  Diagnostics: None.   Past Medical History: Patient Active Problem List   Diagnosis Date Noted   DOE (dyspnea on exertion) 03/08/2022   Primary osteoarthritis of both hands 02/01/2019   S/P lumbar spinal fusion 03/20/2017   Recurrent left inguinal hernia 11/13/2015   S/P lumbar laminectomy 02/10/2014   Past Medical History:  Diagnosis Date   Anxiety    Arthritis    BPH (benign prostatic hypertrophy)    on meds   Depression    Dysrhythmia    1 episode of AF but never came back   Enlarged aorta (HCC)    GERD (gastroesophageal reflux disease)    on meds   Hyperlipidemia    Hypertension    Neuromuscular disorder (HCC)     right groin with numbness  states he had back surgery in hopes to correct this, but still there   Peripheral vascular disease (HCC)    surface blood clot x1 top of left foot   Pneumonia    years ago 5,   Past Surgical History: Past Surgical History:  Procedure Laterality Date   ADENOIDECTOMY     BACK SURGERY     EYE SURGERY Bilateral    cataracts   HERNIA REPAIR     INGUINAL HERNIA REPAIR Left 11/13/2015   Procedure: OPEN REPAIR LEFT INGUINAL HERNIA;  Surgeon: Morene Olives, MD;  Location: [facility] OR;  Service: General;  Laterality: Left;   LAMINECTOMY WITH POSTERIOR LATERAL ARTHRODESIS LEVEL 2 N/A 03/20/2017   Procedure: Posterior Lateral Fusion - L1 - L3, segmental fixation L1-3;  Surgeon: Joshua Alm RAMAN, MD;  Location: [facility] OR;  Service: Neurosurgery;  Laterality: N/A;   LUMBAR LAMINECTOMY/DECOMPRESSION MICRODISCECTOMY N/A 02/10/2014    Procedure: Lumbar One to Two, Lumbar Two to Three Lumbar laminectomy for stenosis/epidural mass;  Surgeon: Alm RAMAN Joshua, MD;  Location: [facility] NEURO ORS;  Service: Neurosurgery;  Laterality: N/A;  L1-2 L2-3 Lumbar laminectomy for stenosis/epidural mass   TONSILLECTOMY     Medication List:  Current Outpatient Medications  Medication Sig Dispense Refill   Ascorbic Acid  500 MG CAPS as directed Orally     cyanocobalamin (VITAMIN B12) 1000 MCG tablet Take 1,000 mcg by mouth daily.     esomeprazole (NEXIUM) 40 MG capsule Take 40 mg by mouth daily.     ezetimibe (ZETIA) 10 MG tablet Take 10 mg by mouth. M, W, Friday only     famotidine (PEPCID AC) 10 MG tablet 1 tablet as needed Orally Twice a day     fexofenadine (ALLEGRA ODT) 30 MG disintegrating tablet Take 30 mg by mouth daily.     fluticasone (FLONASE ALLERGY RELIEF) 50 MCG/ACT nasal spray 1 spray in each nostril Nasally Once a day; Duration: 30 day(s)     hydrOXYzine  (ATARAX ) 10 MG tablet Take 1 tablet (10 mg total) by mouth at bedtime as needed for itching. 30 tablet 2   lidocaine  (LIDODERM ) 5 % Place 1 patch onto the skin as needed for pain.     losartan -hydrochlorothiazide  (HYZAAR) 50-12.5 MG tablet Take 1 tablet by mouth daily.     metoprolol  succinate (TOPROL -XL) 25 MG 24 hr tablet Take 25 mg by mouth daily.     metroNIDAZOLE (METROGEL) 1 % gel Apply topically as needed.     MIRALAX 17 GM/SCOOP powder as needed.     NON FORMULARY ALTERIL Sleep Aid     NUCYNTA  50 MG tablet Take 50 mg by mouth as needed.     terazosin  (HYTRIN ) 5 MG capsule Take 10 mg by mouth at bedtime.     No current facility-administered medications for this visit.   Allergies: Allergies  Allergen Reactions   Codeine Hives    Other Reaction(s): Unknown   Other Itching and Other (See Comments)    OPIODS--All over itch   Including Laudanum   Azithromycin     Other Reaction(s): cold sores   Cephalexin Itching   Lorazepam Other (See Comments)    Pt stated, I had  a side effect -- I could not control my urination   Penicillins Other (See Comments)   Amoxicillin Other (See Comments)    Cold sores   Has patient had a PCN reaction causing immediate rash, facial/tongue/throat swelling, SOB or lightheadedness with hypotension: No  Has patient had a PCN reaction causing severe  rash involving mucus membranes or skin necrosis: Yes  Has patient had a PCN reaction that required hospitalization: No  Has patient had a PCN reaction occurring within the last 10 years: No  If all of the above answers are NO, then may proceed with Cephalosporin use.   Hydrocodone Other (See Comments) and Rash   Social History: Social History   Socioeconomic History   Marital status: Married    Spouse name: Not on file   Number of children: 1   Years of education: Not on file   Highest education level: Not on file  Occupational History   Not on file  Tobacco Use   Smoking status: Never    Passive exposure: Never   Smokeless tobacco: Never  Vaping Use   Vaping status: Never Used  Substance and Sexual Activity   Alcohol use: Not Currently   Drug use: No   Sexual activity: Not on file  Other Topics Concern   Not on file  Social History Narrative   Not on file   Social Drivers of Health   Financial Resource Strain: Not on file  Food Insecurity: Not on file  Transportation Needs: Not on file  Physical Activity: Not on file  Stress: Not on file  Social Connections: Not on file   Lives in a house. Smoking: denies Occupation: retired  Landscape architect HistorySurveyor, minerals in the house: not sure Carpet in the family room: no Carpet in the bedroom: yes Heating: gas Cooling: central Pet: no  Family History: Family History  Problem Relation Age of Onset   Stroke Mother    Heart Problems Father    Macular degeneration Sister    Healthy Daughter    Review of Systems  Constitutional:  Negative for appetite change, chills, fever and unexpected  weight change.  HENT:  Negative for congestion and rhinorrhea.   Eyes:  Negative for itching.  Respiratory:  Negative for cough, chest tightness, shortness of breath and wheezing.   Cardiovascular:  Negative for chest pain.  Gastrointestinal:  Negative for abdominal pain.  Genitourinary:  Negative for difficulty urinating.  Skin:  Negative for rash.       Itchy scalp  Neurological:  Negative for headaches.    Objective: BP 118/70   Pulse 100   Temp 98 F (36.7 C)   Ht 5' 9.69 (1.77 m)   Wt 214 lb 8 oz (97.3 kg)   SpO2 93%   BMI 31.06 kg/m  Body mass index is 31.06 kg/m. Physical Exam Vitals and nursing note reviewed.  Constitutional:      Appearance: Normal appearance. He is well-developed.  HENT:     Head: Normocephalic and atraumatic.     Right Ear: Tympanic membrane and external ear normal.     Left Ear: Tympanic membrane and external ear normal.     Nose: Nose normal.     Mouth/Throat:     Mouth: Mucous membranes are moist.     Pharynx: Oropharynx is clear.  Eyes:     Conjunctiva/sclera: Conjunctivae normal.  Cardiovascular:     Rate and Rhythm: Normal rate and regular rhythm.     Heart sounds: Normal heart sounds. No murmur heard.    No friction rub. No gallop.  Pulmonary:     Effort: Pulmonary effort is normal.     Breath sounds: Normal breath sounds. No wheezing, rhonchi or rales.  Musculoskeletal:     Cervical back: Neck supple.  Skin:    General: Skin is warm.  Findings: No rash.  Neurological:     Mental Status: He is alert and oriented to person, place, and time.  Psychiatric:        Behavior: Behavior normal.    The plan was reviewed with the patient/family, and all questions/concerned were addressed.  It was my pleasure to see Richard Williams today and participate in his care. Please feel free to contact me with any questions or concerns.  Sincerely,  Orlan Cramp, DO Allergy & Immunology  [facility]  [place]  office: [phone] [facility] office: [phone]

## 2023-12-18 ENCOUNTER — Encounter: Payer: Self-pay | Admitting: Allergy

## 2023-12-18 ENCOUNTER — Ambulatory Visit: Admitting: Allergy

## 2023-12-18 VITALS — BP 118/70 | HR 100 | Temp 98.0°F | Ht 69.69 in | Wt 214.5 lb

## 2023-12-18 DIAGNOSIS — J310 Chronic rhinitis: Secondary | ICD-10-CM

## 2023-12-18 DIAGNOSIS — L299 Pruritus, unspecified: Secondary | ICD-10-CM | POA: Diagnosis not present

## 2023-12-18 MED ORDER — HYDROXYZINE HCL 10 MG PO TABS: 10.0000 mg | ORAL_TABLET | Freq: Every evening | ORAL | 2 refills | Status: DC | PRN

## 2023-12-18 NOTE — Patient Instructions (Addendum)
 Skin/itching Unclear etiology. See below for proper skin care. Use fragrance free and dye free products. No dryer sheets or fabric softener.   Only use shampoo on the scalp and double rinse. No conditioner and no gel.  Limit hat use.  May take hydroxyzine  10mg  1 hour before bedtime for itching. This may make you tired.  If the above regimen does not work, then we can try gabapentin  next.   Nasal symptoms Use Flonase (fluticasone) nasal spray 1-2 sprays per nostril once a day as needed for nasal congestion.  Nasal saline spray (i.e., Simply Saline) or nasal saline lavage (i.e., NeilMed) is recommended as needed and prior to medicated nasal sprays. Try to wean off the decongestant pills - it is not recommended to take decongestant medications on a long term basis.   Follow up in 6 weeks or sooner if needed.   Skin care recommendations  Bath time: Always use lukewarm water. AVOID very hot or cold water. Keep bathing time to 5-10 minutes. Do NOT use bubble bath. Use a mild soap and use just enough to wash the dirty areas. Do NOT scrub skin vigorously.  After bathing, pat dry your skin with a towel. Do NOT rub or scrub the skin.  Moisturizers and prescriptions:  ALWAYS apply moisturizers immediately after bathing (within 3 minutes). This helps to lock-in moisture. Use the moisturizer several times a day over the whole body. Good summer moisturizers include: Aveeno, CeraVe, Cetaphil. Good winter moisturizers include: Aquaphor, Vaseline, Cerave, Cetaphil, Eucerin, Vanicream. When using moisturizers along with medications, the moisturizer should be applied about one hour after applying the medication to prevent diluting effect of the medication or moisturize around where you applied the medications. When not using medications, the moisturizer can be continued twice daily as maintenance.  Laundry and clothing: Avoid laundry products with added color or perfumes. Use unscented  hypo-allergenic laundry products such as Tide free, Cheer free & gentle, and All free and clear.  If the skin still seems dry or sensitive, you can try double-rinsing the clothes. Avoid tight or scratchy clothing such as wool. Do not use fabric softeners or dyer sheets.

## 2023-12-22 ENCOUNTER — Ambulatory Visit: Admitting: Podiatry

## 2023-12-22 DIAGNOSIS — B351 Tinea unguium: Secondary | ICD-10-CM

## 2023-12-22 DIAGNOSIS — M79674 Pain in right toe(s): Secondary | ICD-10-CM | POA: Diagnosis not present

## 2023-12-22 DIAGNOSIS — M79675 Pain in left toe(s): Secondary | ICD-10-CM

## 2023-12-22 NOTE — Progress Notes (Signed)
 Chief Complaint  Patient presents with   Tinea Pedis    Patient is here for bilateral nail fungus, mostly on right foot    HPI: 83 y.o. male presenting today as a new patient for concern of possible toenail fungus to the right hallux nail plate.  He says that about 1 week ago the toenail became somewhat red and tender.  He was able to trim the nail back and he has been spraying onto the toenail and fungicure OTC topical it has improved and is no longer symptomatic or tender.  He is concerned for toenail fungus  Past Medical History:  Diagnosis Date   Anxiety    Arthritis    BPH (benign prostatic hypertrophy)    on meds   Depression    Dysrhythmia    1 episode of AF but never came back   Enlarged aorta (HCC)    GERD (gastroesophageal reflux disease)    on meds   Hyperlipidemia    Hypertension    Neuromuscular disorder (HCC)     right groin with numbness states he had back surgery in hopes to correct this, but still there   Peripheral vascular disease (HCC)    surface blood clot x1 top of left foot   Pneumonia    years ago 5,    Past Surgical History:  Procedure Laterality Date   ADENOIDECTOMY     BACK SURGERY     EYE SURGERY Bilateral    cataracts   HERNIA REPAIR     INGUINAL HERNIA REPAIR Left 11/13/2015   Procedure: OPEN REPAIR LEFT INGUINAL HERNIA;  Surgeon: Morene Olives, MD;  Location: [facility] OR;  Service: General;  Laterality: Left;   LAMINECTOMY WITH POSTERIOR LATERAL ARTHRODESIS LEVEL 2 N/A 03/20/2017   Procedure: Posterior Lateral Fusion - L1 - L3, segmental fixation L1-3;  Surgeon: Joshua Alm RAMAN, MD;  Location: [facility] OR;  Service: Neurosurgery;  Laterality: N/A;   LUMBAR LAMINECTOMY/DECOMPRESSION MICRODISCECTOMY N/A 02/10/2014   Procedure: Lumbar One to Two, Lumbar Two to Three Lumbar laminectomy for stenosis/epidural mass;  Surgeon: Alm RAMAN Joshua, MD;  Location: [facility] NEURO ORS;  Service: Neurosurgery;  Laterality: N/A;  L1-2 L2-3 Lumbar laminectomy for  stenosis/epidural mass   TONSILLECTOMY      Allergies  Allergen Reactions   Codeine Hives    Other Reaction(s): Unknown   Other Itching and Other (See Comments)    OPIODS--All over itch   Including Laudanum   Azithromycin     Other Reaction(s): cold sores   Cephalexin Itching   Lorazepam Other (See Comments)    Pt stated, I had a side effect -- I could not control my urination   Penicillins Other (See Comments)   Amoxicillin Other (See Comments)    Cold sores   Has patient had a PCN reaction causing immediate rash, facial/tongue/throat swelling, SOB or lightheadedness with hypotension: No  Has patient had a PCN reaction causing severe rash involving mucus membranes or skin necrosis: Yes  Has patient had a PCN reaction that required hospitalization: No  Has patient had a PCN reaction occurring within the last 10 years: No  If all of the above answers are NO, then may proceed with Cephalosporin use.   Hydrocodone Other (See Comments) and Rash     Physical Exam: General: The patient is alert and oriented x3 in no acute distress.  Dermatology: Skin is warm, dry and supple bilateral lower extremities.  The bilateral great toenail plates are elongated with some light tenderness with  palpation of the distal tip of the toenail.  Patient symptoms are likely coming from a elongated toenail instead of fungus.  I do not see any evidence of fungal nail infection  Vascular: Palpable pedal pulses bilaterally. Capillary refill within normal limits.  No appreciable edema.  No erythema.  Neurological: Grossly intact via light touch  Musculoskeletal Exam: No pedal deformities noted   Assessment/Plan of Care: 1.  Elongated painful toenails bilateral great toes  -Patient evaluated. -Mechanical debridement of the bilateral great toenails was performed today using a nail nipper without incident or bleeding.  The patient did feel some relief with this -Discontinue topical  antifungal -Recommend shoes that do not irritate or constrict the toebox area -Return to clinic PRN       Thresa EMERSON Sar, DPM [facility]  Dr. Thresa EMERSON Sar, DPM    [address] [place], [place] [address]                Office [phone]  Fax [phone]

## 2024-01-27 ENCOUNTER — Other Ambulatory Visit: Payer: Self-pay

## 2024-01-27 ENCOUNTER — Encounter: Payer: Self-pay | Admitting: Allergy

## 2024-01-27 ENCOUNTER — Ambulatory Visit: Admitting: Allergy

## 2024-01-27 VITALS — BP 132/88 | HR 95 | Temp 97.8°F | Resp 18 | Wt 216.8 lb

## 2024-01-27 DIAGNOSIS — J310 Chronic rhinitis: Secondary | ICD-10-CM

## 2024-01-27 DIAGNOSIS — L299 Pruritus, unspecified: Secondary | ICD-10-CM | POA: Diagnosis not present

## 2024-01-27 MED ORDER — GABAPENTIN 100 MG PO CAPS: 100.0000 mg | ORAL_CAPSULE | Freq: Two times a day (BID) | ORAL | 1 refills | Status: AC | PRN

## 2024-01-27 NOTE — Patient Instructions (Addendum)
 Skin/itching Continue proper skin care.  Only use shampoo on the scalp and double rinse. No conditioner and no gel.  Start gabapentin  100mg  1 tablet 1 hour before bedtime as needed for itching. May increase to twice a day if needed. Be careful as it can cause some drowsiness.  Nasal symptoms Use Flonase (fluticasone) nasal spray 1-2 sprays per nostril once a day as needed for nasal congestion.  Nasal saline spray (i.e., Simply Saline) or nasal saline lavage (i.e., NeilMed) is recommended as needed and prior to medicated nasal sprays. Try to wean off the decongestant pills - it is not recommended to take decongestant medications on a long term basis. `  Follow up in 2 months or sooner if needed.   Skin care recommendations  Bath time: Always use lukewarm water. AVOID very hot or cold water. Keep bathing time to 5-10 minutes. Do NOT use bubble bath. Use a mild soap and use just enough to wash the dirty areas. Do NOT scrub skin vigorously.  After bathing, pat dry your skin with a towel. Do NOT rub or scrub the skin.  Moisturizers and prescriptions:  ALWAYS apply moisturizers immediately after bathing (within 3 minutes). This helps to lock-in moisture. Use the moisturizer several times a day over the whole body. Good summer moisturizers include: Aveeno, CeraVe, Cetaphil. Good winter moisturizers include: Aquaphor, Vaseline, Cerave, Cetaphil, Eucerin, Vanicream. When using moisturizers along with medications, the moisturizer should be applied about one hour after applying the medication to prevent diluting effect of the medication or moisturize around where you applied the medications. When not using medications, the moisturizer can be continued twice daily as maintenance.  Laundry and clothing: Avoid laundry products with added color or perfumes. Use unscented hypo-allergenic laundry products such as Tide free, Cheer free & gentle, and All free and clear.  If the skin still seems dry  or sensitive, you can try double-rinsing the clothes. Avoid tight or scratchy clothing such as wool. Do not use fabric softeners or dyer sheets.

## 2024-01-27 NOTE — Progress Notes (Signed)
 Follow Up Note  RE: Richard Williams MRN: [id number] DOB: Aug 17, 1940 Date of Office Visit: 01/27/2024  Referring provider: Henry Ingle, MD Primary care provider: Henry Ingle, MD  Chief Complaint: Follow-up  History of Present Illness: I had the pleasure of seeing Richard Williams for a follow up visit at the [facility] on 01/27/2024. He is a 83 y.o. male, who is being followed for scalp pruritus and allergic rhinitis. His previous allergy office visit was on 12/18/2023 with Dr. Luke. Today is a regular follow up visit.  Discussed the use of AI scribe software for clinical note transcription with the patient, who gave verbal consent to proceed.    He has been experiencing persistent scalp itching despite various treatments. He previously tried hydroxyzine  for thirty days without relief and did not renew the prescription. The itch is intermittent and most severe at the back of his head where he is losing hair. He uses Vanicream and other recommended products, avoids conditioner.  He applies Scalpacin after shampooing, which provides relief for a day to a day and a half, but the itch returns. He is not showering every day, and when the itch intensifies, he reapplies Scalpacin. No rash has been observed accompanying the itch.  He is currently taking Flonase for his sinuses and has not changed his medication regimen.  He has seen a dermatologist in the past who prescribed a medication that was only to be taken for two weeks due to potential adverse effects, but he did not find it satisfactory. He also inquired about dark spots on his skin, noting they seem to get darker with age and sun exposure.  He reports that his eating habits and breathing are unchanged.     Assessment and Plan: Richard Williams is a 83 y.o. 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 Previous dermatological evaluation unremarkable.  Cold water provides temporary relief. Interim history - hydroxyzine  ineffective. Only scalpacin gives temporary relief. No rash/lesions.  Continue proper skin care.  Only use shampoo on the scalp and double rinse. No conditioner and no gel.  Start gabapentin  100mg  1 tablet 1 hour before bedtime as needed for itching. May increase to twice a day if needed. Be careful as it can cause some drowsiness.   Nonallergic rhinitis Past history - Managed with Allegra D and Flonase. Skin testing in the past was negative to environmental panel per patient.  Interim history - Still taking Allegra D daily as that's the only thing that helps with the nasal congestion.  Use Flonase (fluticasone) nasal spray 1-2 sprays per nostril once a day as needed for nasal congestion.  Nasal saline spray (i.e., Simply Saline) or nasal saline lavage (i.e., NeilMed) is recommended as needed and prior to medicated nasal sprays. Try to wean off the decongestant pills - it is not recommended to take decongestant medications on a long term basis.   Return in about 2 months (around 03/28/2024).  Meds ordered this encounter  Medications   gabapentin  (NEURONTIN ) 100 MG capsule    Sig: Take 1 capsule (100 mg total) by mouth 2 (two) times daily as needed (itching).    Dispense:  60 capsule    Refill:  1   Lab Orders  No laboratory test(s) ordered today    Diagnostics: None.   Medication List:  Current Outpatient Medications  Medication Sig Dispense Refill   Ascorbic Acid  500 MG CAPS as directed  Orally     cyanocobalamin (VITAMIN B12) 1000 MCG tablet Take 1,000 mcg by mouth daily.     esomeprazole (NEXIUM) 40 MG capsule Take 40 mg by mouth daily.     ezetimibe (ZETIA) 10 MG tablet Take 10 mg by mouth. M, W, Friday only     famotidine (PEPCID AC) 10 MG tablet 1 tablet as needed Orally Twice a day     fexofenadine (ALLEGRA ODT) 30 MG disintegrating tablet Take 30 mg by mouth daily.     fluticasone (FLONASE ALLERGY  RELIEF) 50 MCG/ACT nasal spray 1 spray in each nostril Nasally Once a day; Duration: 30 day(s)     gabapentin  (NEURONTIN ) 100 MG capsule Take 1 capsule (100 mg total) by mouth 2 (two) times daily as needed (itching). 60 capsule 1   lidocaine  (LIDODERM ) 5 % Place 1 patch onto the skin as needed for pain.     losartan -hydrochlorothiazide  (HYZAAR) 50-12.5 MG tablet Take 1 tablet by mouth daily.     metoprolol  succinate (TOPROL -XL) 25 MG 24 hr tablet Take 25 mg by mouth daily.     metroNIDAZOLE (METROGEL) 1 % gel Apply topically as needed.     MIRALAX 17 GM/SCOOP powder as needed.     NON FORMULARY ALTERIL Sleep Aid     NUCYNTA  50 MG tablet Take 50 mg by mouth as needed.     terazosin  (HYTRIN ) 5 MG capsule Take 10 mg by mouth at bedtime.     No current facility-administered medications for this visit.   Allergies: Allergies  Allergen Reactions   Codeine Hives    Other Reaction(s): Unknown   Other Itching and Other (See Comments)    OPIODS--All over itch   Including Laudanum   Azithromycin     Other Reaction(s): cold sores   Cephalexin Itching   Lorazepam Other (See Comments)    Pt stated, I had a side effect -- I could not control my urination   Penicillins Other (See Comments)   Amoxicillin Other (See Comments)    Cold sores   Has patient had a PCN reaction causing immediate rash, facial/tongue/throat swelling, SOB or lightheadedness with hypotension: No  Has patient had a PCN reaction causing severe rash involving mucus membranes or skin necrosis: Yes  Has patient had a PCN reaction that required hospitalization: No  Has patient had a PCN reaction occurring within the last 10 years: No  If all of the above answers are NO, then may proceed with Cephalosporin use.   Hydrocodone Other (See Comments) and Rash   I reviewed his past medical history, social history, family history, and environmental history and no significant changes have been reported from his previous  visit.  Review of Systems  Constitutional:  Negative for appetite change, chills, fever and unexpected weight change.  HENT:  Negative for congestion and rhinorrhea.   Eyes:  Negative for itching.  Respiratory:  Negative for cough, chest tightness, shortness of breath and wheezing.   Cardiovascular:  Negative for chest pain.  Gastrointestinal:  Negative for abdominal pain.  Genitourinary:  Negative for difficulty urinating.  Skin:  Negative for rash.       Itchy scalp  Neurological:  Negative for headaches.    Objective: BP 132/88   Pulse 95   Temp 97.8 F (36.6 C) (Temporal)   Resp 18   Wt 216 lb 12.8 oz (98.3 kg)   SpO2 98%   BMI 31.39 kg/m  Body mass index is 31.39 kg/m. Physical Exam Vitals and nursing  note reviewed.  Constitutional:      Appearance: Normal appearance. He is well-developed.  HENT:     Head: Normocephalic and atraumatic.     Right Ear: Tympanic membrane and external ear normal.     Left Ear: Tympanic membrane and external ear normal.     Nose: Nose normal.     Mouth/Throat:     Mouth: Mucous membranes are moist.     Pharynx: Oropharynx is clear.  Eyes:     Conjunctiva/sclera: Conjunctivae normal.  Cardiovascular:     Rate and Rhythm: Normal rate and regular rhythm.     Heart sounds: Normal heart sounds. No murmur heard.    No friction rub. No gallop.  Pulmonary:     Effort: Pulmonary effort is normal.     Breath sounds: Normal breath sounds. No wheezing, rhonchi or rales.  Musculoskeletal:     Cervical back: Neck supple.  Skin:    General: Skin is warm and dry.     Findings: No rash.     Comments: Some dry skin on the scalp.  Neurological:     Mental Status: He is alert and oriented to person, place, and time.  Psychiatric:        Behavior: Behavior normal.    Previous notes and tests were reviewed. The plan was reviewed with the patient/family, and all questions/concerned were addressed.  It was my pleasure to see Richard Williams today and  participate in his care. Please feel free to contact me with any questions or concerns.  Sincerely,  Orlan Cramp, DO Allergy & Immunology  [facility]  [facility] office: [phone] [facility] office: [phone]

## 2024-03-30 ENCOUNTER — Ambulatory Visit: Admitting: Allergy

## 2024-05-14 ENCOUNTER — Telehealth: Payer: Self-pay | Admitting: Cardiology

## 2024-05-14 NOTE — Telephone Encounter (Signed)
 Patient was calling to verify is echo date but there is no order in for him. Please advise

## 2024-05-17 ENCOUNTER — Other Ambulatory Visit: Payer: Self-pay

## 2024-05-17 DIAGNOSIS — I77810 Thoracic aortic ectasia: Secondary | ICD-10-CM

## 2024-05-17 NOTE — Telephone Encounter (Signed)
 Order placed per last visit with Dr Ladona. Pt notified and understands he will be getting a call from the scheduler to confirm.

## 2024-07-20 ENCOUNTER — Ambulatory Visit: Admitting: Rheumatology

## 2024-07-26 ENCOUNTER — Ambulatory Visit (HOSPITAL_COMMUNITY)

## 2024-08-09 ENCOUNTER — Ambulatory Visit: Payer: Medicare Other | Admitting: Cardiology
